# Patient Record
Sex: Male | Born: 1955 | Race: White | Hispanic: No | Marital: Single | Smoking: Never smoker
Health system: Southern US, Community
[De-identification: ages and names within clinical notes are randomized; demographics above are authoritative.]

## PROBLEM LIST (undated history)

## (undated) DIAGNOSIS — K589 Irritable bowel syndrome without diarrhea: Secondary | ICD-10-CM

## (undated) DIAGNOSIS — D126 Benign neoplasm of colon, unspecified: Secondary | ICD-10-CM

## (undated) DIAGNOSIS — E041 Nontoxic single thyroid nodule: Secondary | ICD-10-CM

## (undated) DIAGNOSIS — D61818 Other pancytopenia: Secondary | ICD-10-CM

## (undated) DIAGNOSIS — E039 Hypothyroidism, unspecified: Secondary | ICD-10-CM

## (undated) DIAGNOSIS — R011 Cardiac murmur, unspecified: Secondary | ICD-10-CM

## (undated) DIAGNOSIS — E291 Testicular hypofunction: Secondary | ICD-10-CM

## (undated) DIAGNOSIS — K648 Other hemorrhoids: Secondary | ICD-10-CM

## (undated) DIAGNOSIS — K602 Anal fissure, unspecified: Secondary | ICD-10-CM

## (undated) HISTORY — DX: Anal fissure, unspecified: K60.2

## (undated) HISTORY — DX: Hypothyroidism, unspecified: E03.9

## (undated) HISTORY — DX: Testicular hypofunction: E29.1

## (undated) HISTORY — PX: TONSILLECTOMY: SUR1361

## (undated) HISTORY — DX: Nontoxic single thyroid nodule: E04.1

## (undated) HISTORY — DX: Other hemorrhoids: K64.8

## (undated) HISTORY — DX: Benign neoplasm of colon, unspecified: D12.6

## (undated) HISTORY — DX: Irritable bowel syndrome, unspecified: K58.9

## (undated) HISTORY — DX: Other pancytopenia: D61.818

## (undated) HISTORY — DX: Cardiac murmur, unspecified: R01.1

---

## 2003-12-26 ENCOUNTER — Emergency Department (HOSPITAL_COMMUNITY): Admission: EM | Admit: 2003-12-26 | Discharge: 2003-12-26 | Payer: Self-pay | Admitting: Family Medicine

## 2004-01-06 ENCOUNTER — Emergency Department (HOSPITAL_COMMUNITY): Admission: EM | Admit: 2004-01-06 | Discharge: 2004-01-06 | Payer: Self-pay | Admitting: Family Medicine

## 2004-01-22 ENCOUNTER — Ambulatory Visit: Payer: Self-pay | Admitting: Gastroenterology

## 2004-09-22 ENCOUNTER — Ambulatory Visit: Payer: Self-pay

## 2004-09-30 ENCOUNTER — Ambulatory Visit: Payer: Self-pay

## 2004-10-07 ENCOUNTER — Ambulatory Visit: Payer: Self-pay

## 2004-10-13 ENCOUNTER — Ambulatory Visit: Payer: Self-pay

## 2004-11-11 ENCOUNTER — Ambulatory Visit: Payer: Self-pay

## 2004-11-25 ENCOUNTER — Ambulatory Visit: Payer: Self-pay

## 2004-12-16 ENCOUNTER — Ambulatory Visit: Payer: Self-pay

## 2004-12-30 ENCOUNTER — Ambulatory Visit: Payer: Self-pay

## 2005-01-01 ENCOUNTER — Ambulatory Visit: Payer: Self-pay | Admitting: Gastroenterology

## 2005-01-06 ENCOUNTER — Ambulatory Visit: Payer: Self-pay

## 2005-01-11 DIAGNOSIS — D126 Benign neoplasm of colon, unspecified: Secondary | ICD-10-CM

## 2005-01-11 HISTORY — PX: COLONOSCOPY: SHX174

## 2005-01-11 HISTORY — DX: Benign neoplasm of colon, unspecified: D12.6

## 2005-01-13 ENCOUNTER — Ambulatory Visit: Payer: Self-pay

## 2005-01-20 ENCOUNTER — Ambulatory Visit: Payer: Self-pay

## 2005-02-18 ENCOUNTER — Ambulatory Visit: Payer: Self-pay | Admitting: Internal Medicine

## 2005-02-24 ENCOUNTER — Ambulatory Visit: Payer: Self-pay

## 2005-03-03 ENCOUNTER — Ambulatory Visit: Payer: Self-pay

## 2005-03-10 ENCOUNTER — Ambulatory Visit: Payer: Self-pay

## 2005-03-16 ENCOUNTER — Ambulatory Visit: Payer: Self-pay

## 2005-03-31 ENCOUNTER — Ambulatory Visit: Payer: Self-pay

## 2005-04-07 ENCOUNTER — Ambulatory Visit: Payer: Self-pay

## 2005-04-21 ENCOUNTER — Ambulatory Visit: Payer: Self-pay

## 2005-04-28 ENCOUNTER — Ambulatory Visit: Payer: Self-pay

## 2005-05-05 ENCOUNTER — Ambulatory Visit: Payer: Self-pay

## 2005-05-12 ENCOUNTER — Ambulatory Visit: Payer: Self-pay

## 2005-05-26 ENCOUNTER — Ambulatory Visit: Payer: Self-pay

## 2005-06-09 ENCOUNTER — Ambulatory Visit: Payer: Self-pay

## 2005-06-16 ENCOUNTER — Ambulatory Visit: Payer: Self-pay

## 2005-06-30 ENCOUNTER — Ambulatory Visit: Payer: Self-pay

## 2005-07-13 ENCOUNTER — Ambulatory Visit: Payer: Self-pay

## 2005-07-20 ENCOUNTER — Ambulatory Visit: Payer: Self-pay

## 2005-07-28 ENCOUNTER — Ambulatory Visit: Payer: Self-pay

## 2005-09-15 ENCOUNTER — Ambulatory Visit: Payer: Self-pay

## 2005-10-13 ENCOUNTER — Ambulatory Visit: Payer: Self-pay | Admitting: Internal Medicine

## 2005-10-20 ENCOUNTER — Ambulatory Visit: Payer: Self-pay

## 2005-11-09 ENCOUNTER — Ambulatory Visit: Payer: Self-pay

## 2005-11-16 ENCOUNTER — Ambulatory Visit: Payer: Self-pay

## 2005-11-23 ENCOUNTER — Ambulatory Visit: Payer: Self-pay

## 2005-11-30 ENCOUNTER — Ambulatory Visit: Payer: Self-pay

## 2005-12-14 ENCOUNTER — Ambulatory Visit: Payer: Self-pay

## 2005-12-21 ENCOUNTER — Ambulatory Visit: Payer: Self-pay

## 2005-12-28 ENCOUNTER — Ambulatory Visit: Payer: Self-pay

## 2006-01-12 ENCOUNTER — Ambulatory Visit: Payer: Self-pay

## 2006-01-18 ENCOUNTER — Ambulatory Visit: Payer: Self-pay

## 2006-02-15 ENCOUNTER — Ambulatory Visit: Payer: Self-pay

## 2006-02-22 ENCOUNTER — Ambulatory Visit: Payer: Self-pay

## 2006-03-01 ENCOUNTER — Ambulatory Visit: Payer: Self-pay

## 2006-03-08 ENCOUNTER — Ambulatory Visit: Payer: Self-pay

## 2006-03-16 ENCOUNTER — Ambulatory Visit: Payer: Self-pay

## 2006-09-13 ENCOUNTER — Ambulatory Visit: Payer: Self-pay

## 2006-11-04 DIAGNOSIS — K589 Irritable bowel syndrome without diarrhea: Secondary | ICD-10-CM

## 2006-11-04 DIAGNOSIS — D128 Benign neoplasm of rectum: Secondary | ICD-10-CM

## 2006-11-15 ENCOUNTER — Ambulatory Visit: Payer: Self-pay

## 2006-12-27 ENCOUNTER — Ambulatory Visit: Payer: Self-pay

## 2007-07-11 ENCOUNTER — Ambulatory Visit: Payer: Self-pay

## 2007-08-02 ENCOUNTER — Ambulatory Visit: Payer: Self-pay

## 2007-10-03 ENCOUNTER — Ambulatory Visit: Payer: Self-pay | Admitting: Internal Medicine

## 2007-11-29 ENCOUNTER — Telehealth: Payer: Self-pay | Admitting: Internal Medicine

## 2007-12-20 ENCOUNTER — Ambulatory Visit: Payer: Self-pay

## 2007-12-26 ENCOUNTER — Ambulatory Visit: Payer: Self-pay

## 2008-01-03 ENCOUNTER — Ambulatory Visit: Payer: Self-pay

## 2008-01-17 ENCOUNTER — Ambulatory Visit: Payer: Self-pay

## 2008-02-14 ENCOUNTER — Ambulatory Visit: Payer: Self-pay

## 2008-02-27 ENCOUNTER — Ambulatory Visit: Payer: Self-pay

## 2008-03-20 ENCOUNTER — Ambulatory Visit: Payer: Self-pay

## 2008-04-17 ENCOUNTER — Ambulatory Visit: Payer: Self-pay

## 2008-04-23 ENCOUNTER — Ambulatory Visit: Payer: Self-pay

## 2008-05-29 ENCOUNTER — Ambulatory Visit: Payer: Self-pay

## 2008-06-26 ENCOUNTER — Ambulatory Visit: Payer: Self-pay

## 2008-07-03 ENCOUNTER — Ambulatory Visit: Payer: Self-pay

## 2008-07-10 ENCOUNTER — Ambulatory Visit: Payer: Self-pay

## 2008-07-24 ENCOUNTER — Ambulatory Visit: Payer: Self-pay

## 2008-08-06 ENCOUNTER — Ambulatory Visit: Payer: Self-pay

## 2008-09-10 ENCOUNTER — Ambulatory Visit: Payer: Self-pay

## 2009-02-13 ENCOUNTER — Telehealth: Payer: Self-pay | Admitting: Internal Medicine

## 2010-02-10 NOTE — Progress Notes (Signed)
Summary: request  Phone Note Call from Patient   Caller: Patient [phone] Summary of Call: Pt req that Dr Cato Mulligan submit a requisition to [facility].... Pt wants to go a Careers adviser / orthodontist due to ongoing jaw pain and will need images prior to scheduling his appt??... Therefore, he adv would like to have either Dr Cato Mulligan or Alvino Chapel, RN call him at their convenience.... Pt can be reached at [phone]. Initial call taken by: Debbra Riding,  February 13, 2009 3:10 PM  Follow-up for Phone Call        Pt called to adv that he was returning Ellen's call Alvino Chapel is currently w/ a pt)... Pt req that either Alvino Chapel, RN or Dr Cato Mulligan return his call ASAP. Follow-up by: Debbra Riding,  February 14, 2009 9:22 AM  Additional Follow-up for Phone Call Additional follow up Details #1::        Requests order for TMJ MRI (40347) w/o contrast and mandible complete xray ([id number]) at [facility] Liborio Nixon at [phone] ph, [phone] fax) for jaw pain.  Awoke 4 weeks ago with jaw pain,. Saw dds who will not order (lisa adornetto) but sent to dental surgeon who requires appt at $300.  Two weeks ago pain subsided except clicking TMJ and two days ago sharp pain from jaw into neck so jaw is lopsided to left.  Pt out of work so trying to cut corners of appts.  Explained he may need appt before can be ordered.  LAST SEEN 10/03/07. Additional Follow-up by: Gladis Riffle, RN,  February 14, 2009 2:27 PM    Additional Follow-up for Phone Call Additional follow up Details #2::    this type of MRI should be ordered by oral surgeon---I have never ordered before. would be reasonable for him to see oral surgeon  Follow-up by: Birdie Sons MD,  February 14, 2009 3:20 PM  Additional Follow-up for Phone Call Additional follow up Details #3:: Details for Additional Follow-up Action Taken: Patient notified.  Additional Follow-up by: Gladis Riffle, RN,  February 17, 2009 12:22 PM

## 2010-04-08 ENCOUNTER — Telehealth: Payer: Self-pay | Admitting: Internal Medicine

## 2010-04-08 NOTE — Telephone Encounter (Signed)
Pt had labs done at [facility] and his WBC was 2.4 and his Platelets were 101.  He said they have been going down since 1998 and he feels fine.  He is unemployed and a little fatigue but feels fine and is very active

## 2010-04-08 NOTE — Telephone Encounter (Signed)
Pt called and said that he rcvd some lab results that are very alarming and is needing call back asap. Needs to know what Dr Cato Mulligan wants him to do.

## 2010-04-09 NOTE — Telephone Encounter (Signed)
LMTCB

## 2010-04-09 NOTE — Telephone Encounter (Signed)
I haven't seen him in a few years. I'm not sure what his laboratory work indicates. If he wanted a professional opinion I would recommend a full physical perhaps followed by a hematology consult.

## 2010-04-10 NOTE — Telephone Encounter (Signed)
Pt aware and will call back if he wants to schedule appt

## 2010-04-10 NOTE — Telephone Encounter (Signed)
LMTCB

## 2010-07-20 ENCOUNTER — Ambulatory Visit (INDEPENDENT_AMBULATORY_CARE_PROVIDER_SITE_OTHER): Payer: Self-pay | Admitting: Internal Medicine

## 2010-07-20 ENCOUNTER — Encounter: Payer: Self-pay | Admitting: Internal Medicine

## 2010-07-20 DIAGNOSIS — R091 Pleurisy: Secondary | ICD-10-CM

## 2010-07-20 NOTE — Progress Notes (Signed)
  Subjective:    Patient ID: Stephen [name], male    DOB: Oct 17, 1955, 55 y.o.   MRN: [id number]  HPI  55 year old patient who woke at 7:30 this morning with left upper quadrant discomfort. He describes a very sharp localized pain that is aggravated by deep inspiration. Pain is also aggravated by lying on the left lateral decubitus position. No shortness of breath cough or URI symptoms. He states that he has had similar pain intermittently since 2002. He also states that he had a ENT evaluation 3 months ago. He is under much situational stress.    Review of Systems  Cardiovascular: Positive for chest pain.       Objective:   Physical Exam  Constitutional: He is oriented to person, place, and time. He appears well-developed.  HENT:  Head: Normocephalic.  Right Ear: External ear normal.  Left Ear: External ear normal.  Eyes: Conjunctivae and EOM are normal.  Neck: Normal range of motion.  Cardiovascular: Normal rate and normal heart sounds.   Pulmonary/Chest: Effort normal and breath sounds normal. No respiratory distress. He has no wheezes. He has no rales.       No rub  Abdominal: Soft. Bowel sounds are normal. He exhibits no distension. There is no tenderness. There is no rebound.       There was no tenderness along the left costal margin  Musculoskeletal: Normal range of motion. He exhibits no edema and no tenderness.  Neurological: He is alert and oriented to person, place, and time.  Psychiatric: He has a normal mood and affect. His behavior is normal.          Assessment & Plan:   Pleurisy. Etiology obscure. He has had several similar episodes over the past decade with  prompt resolution. We'll give samples of  Vimovo to take twice daily. He'll call if he does not improve or if he develops any new symptoms

## 2010-07-20 NOTE — Patient Instructions (Signed)
Vimovo 1 twice daily Call or return to clinic prn if these symptoms worsen or fail to improve as anticipated.  

## 2010-08-26 ENCOUNTER — Ambulatory Visit (INDEPENDENT_AMBULATORY_CARE_PROVIDER_SITE_OTHER): Payer: Self-pay

## 2010-08-26 DIAGNOSIS — F4320 Adjustment disorder, unspecified: Secondary | ICD-10-CM

## 2010-09-08 ENCOUNTER — Ambulatory Visit: Payer: Self-pay

## 2010-09-09 ENCOUNTER — Ambulatory Visit (INDEPENDENT_AMBULATORY_CARE_PROVIDER_SITE_OTHER): Payer: Self-pay

## 2010-09-09 ENCOUNTER — Ambulatory Visit: Payer: Self-pay

## 2010-09-09 DIAGNOSIS — F4320 Adjustment disorder, unspecified: Secondary | ICD-10-CM

## 2010-09-23 ENCOUNTER — Ambulatory Visit (INDEPENDENT_AMBULATORY_CARE_PROVIDER_SITE_OTHER): Payer: Self-pay

## 2010-09-23 DIAGNOSIS — F4320 Adjustment disorder, unspecified: Secondary | ICD-10-CM

## 2010-10-06 ENCOUNTER — Ambulatory Visit (INDEPENDENT_AMBULATORY_CARE_PROVIDER_SITE_OTHER): Payer: Self-pay

## 2010-10-06 ENCOUNTER — Telehealth: Payer: Self-pay | Admitting: Internal Medicine

## 2010-10-06 DIAGNOSIS — F4320 Adjustment disorder, unspecified: Secondary | ICD-10-CM

## 2010-10-06 NOTE — Telephone Encounter (Signed)
Please return call about a condition. Would like a call from [facility] today.

## 2010-10-06 NOTE — Telephone Encounter (Signed)
Pt must have called Triage back between calls, but he wanted to know who to see for a " synovial cyst"  We had a long discussion about his symptoms, and I advised him to call ORTHO and discuss his condition, and they will prob send him to a hand surgeon.  Pt was pleased with this advice as he did not want to see more than one MD unless it was absolutely necessary.  Discussed it briefly with Dr. Artist Pais, and he agreed.

## 2010-10-28 ENCOUNTER — Ambulatory Visit (INDEPENDENT_AMBULATORY_CARE_PROVIDER_SITE_OTHER): Payer: Self-pay

## 2010-10-28 DIAGNOSIS — F4320 Adjustment disorder, unspecified: Secondary | ICD-10-CM

## 2010-11-11 ENCOUNTER — Ambulatory Visit (INDEPENDENT_AMBULATORY_CARE_PROVIDER_SITE_OTHER): Payer: Self-pay

## 2010-11-11 DIAGNOSIS — F4320 Adjustment disorder, unspecified: Secondary | ICD-10-CM

## 2011-02-02 ENCOUNTER — Ambulatory Visit (INDEPENDENT_AMBULATORY_CARE_PROVIDER_SITE_OTHER): Payer: Self-pay

## 2011-02-02 DIAGNOSIS — F4320 Adjustment disorder, unspecified: Secondary | ICD-10-CM

## 2011-02-15 NOTE — Progress Notes (Addendum)
  Subjective:    Patient ID: Stephen [name], male    DOB: 04-25-1955, 56 y.o.   MRN: [id number]  HPI    Review of Systems  Cardiovascular: Chest pain: complains of noncardiac chest wall pleurisy.       Objective:   Physical Exam        Assessment & Plan:

## 2011-03-09 ENCOUNTER — Encounter: Payer: Self-pay | Admitting: Internal Medicine

## 2011-03-23 ENCOUNTER — Ambulatory Visit: Payer: Self-pay

## 2011-03-25 ENCOUNTER — Ambulatory Visit (INDEPENDENT_AMBULATORY_CARE_PROVIDER_SITE_OTHER): Payer: Self-pay

## 2011-03-25 DIAGNOSIS — F4320 Adjustment disorder, unspecified: Secondary | ICD-10-CM

## 2011-05-05 ENCOUNTER — Ambulatory Visit (INDEPENDENT_AMBULATORY_CARE_PROVIDER_SITE_OTHER): Payer: Self-pay

## 2011-05-05 DIAGNOSIS — F4320 Adjustment disorder, unspecified: Secondary | ICD-10-CM

## 2011-07-20 ENCOUNTER — Ambulatory Visit (INDEPENDENT_AMBULATORY_CARE_PROVIDER_SITE_OTHER): Payer: Self-pay

## 2011-07-20 DIAGNOSIS — F4320 Adjustment disorder, unspecified: Secondary | ICD-10-CM

## 2011-08-11 ENCOUNTER — Ambulatory Visit: Payer: Self-pay

## 2011-08-17 ENCOUNTER — Ambulatory Visit (INDEPENDENT_AMBULATORY_CARE_PROVIDER_SITE_OTHER): Payer: Self-pay

## 2011-08-17 DIAGNOSIS — F4320 Adjustment disorder, unspecified: Secondary | ICD-10-CM

## 2011-10-26 ENCOUNTER — Ambulatory Visit (INDEPENDENT_AMBULATORY_CARE_PROVIDER_SITE_OTHER): Payer: Self-pay

## 2011-10-26 DIAGNOSIS — F4320 Adjustment disorder, unspecified: Secondary | ICD-10-CM

## 2011-11-17 ENCOUNTER — Ambulatory Visit (INDEPENDENT_AMBULATORY_CARE_PROVIDER_SITE_OTHER): Payer: Self-pay

## 2011-11-17 DIAGNOSIS — F4320 Adjustment disorder, unspecified: Secondary | ICD-10-CM

## 2011-11-30 ENCOUNTER — Ambulatory Visit (INDEPENDENT_AMBULATORY_CARE_PROVIDER_SITE_OTHER): Payer: Self-pay

## 2011-11-30 DIAGNOSIS — F4320 Adjustment disorder, unspecified: Secondary | ICD-10-CM

## 2011-12-14 ENCOUNTER — Ambulatory Visit (INDEPENDENT_AMBULATORY_CARE_PROVIDER_SITE_OTHER): Payer: Self-pay

## 2011-12-14 DIAGNOSIS — F4320 Adjustment disorder, unspecified: Secondary | ICD-10-CM

## 2011-12-28 ENCOUNTER — Ambulatory Visit (INDEPENDENT_AMBULATORY_CARE_PROVIDER_SITE_OTHER): Payer: Self-pay

## 2011-12-28 DIAGNOSIS — F4320 Adjustment disorder, unspecified: Secondary | ICD-10-CM

## 2012-01-18 ENCOUNTER — Ambulatory Visit (INDEPENDENT_AMBULATORY_CARE_PROVIDER_SITE_OTHER): Payer: Self-pay

## 2012-01-18 DIAGNOSIS — F4320 Adjustment disorder, unspecified: Secondary | ICD-10-CM

## 2012-02-15 ENCOUNTER — Ambulatory Visit (INDEPENDENT_AMBULATORY_CARE_PROVIDER_SITE_OTHER): Payer: Self-pay

## 2012-02-15 DIAGNOSIS — F4320 Adjustment disorder, unspecified: Secondary | ICD-10-CM

## 2012-03-01 ENCOUNTER — Ambulatory Visit (INDEPENDENT_AMBULATORY_CARE_PROVIDER_SITE_OTHER): Payer: Self-pay

## 2012-03-01 DIAGNOSIS — F4320 Adjustment disorder, unspecified: Secondary | ICD-10-CM

## 2012-04-11 ENCOUNTER — Ambulatory Visit (INDEPENDENT_AMBULATORY_CARE_PROVIDER_SITE_OTHER): Payer: Self-pay

## 2012-04-11 DIAGNOSIS — F4320 Adjustment disorder, unspecified: Secondary | ICD-10-CM

## 2012-04-25 ENCOUNTER — Ambulatory Visit: Payer: Self-pay

## 2012-05-02 ENCOUNTER — Ambulatory Visit (INDEPENDENT_AMBULATORY_CARE_PROVIDER_SITE_OTHER): Payer: Self-pay

## 2012-05-02 DIAGNOSIS — F4320 Adjustment disorder, unspecified: Secondary | ICD-10-CM

## 2012-05-23 ENCOUNTER — Ambulatory Visit (INDEPENDENT_AMBULATORY_CARE_PROVIDER_SITE_OTHER): Payer: Self-pay

## 2012-05-23 DIAGNOSIS — F4320 Adjustment disorder, unspecified: Secondary | ICD-10-CM

## 2012-06-07 ENCOUNTER — Ambulatory Visit (INDEPENDENT_AMBULATORY_CARE_PROVIDER_SITE_OTHER): Payer: Self-pay

## 2012-06-07 DIAGNOSIS — F4320 Adjustment disorder, unspecified: Secondary | ICD-10-CM

## 2012-06-22 ENCOUNTER — Telehealth: Payer: Self-pay | Admitting: Internal Medicine

## 2012-06-22 NOTE — Telephone Encounter (Signed)
Called to ask if Dr Cato Mulligan has the ability to write the prescription for Anastrozole, if he scheduled an appointment. 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  Seeking this particular Rx to inhibit Estradiol.  [facility] provider does not have the ability to write the Rx. Please call back on this number.  Patient informed Dr Cato Mulligan is out of the office until 06/26/12.

## 2012-06-29 NOTE — Telephone Encounter (Signed)
Doubt he needs it- Would like him to review with endocrinology. Refer endocrinology

## 2012-06-30 NOTE — Telephone Encounter (Signed)
Left message for pt to call back  °

## 2012-06-30 NOTE — Telephone Encounter (Signed)
Pt does not want referral to endocrinology.  He has an appt with a [facility] dr in august and he will wait till then

## 2012-07-18 ENCOUNTER — Ambulatory Visit (INDEPENDENT_AMBULATORY_CARE_PROVIDER_SITE_OTHER): Payer: Self-pay

## 2012-07-18 DIAGNOSIS — F4320 Adjustment disorder, unspecified: Secondary | ICD-10-CM

## 2012-10-10 ENCOUNTER — Ambulatory Visit (INDEPENDENT_AMBULATORY_CARE_PROVIDER_SITE_OTHER): Payer: Self-pay

## 2012-10-10 DIAGNOSIS — F4320 Adjustment disorder, unspecified: Secondary | ICD-10-CM

## 2012-12-12 ENCOUNTER — Ambulatory Visit (INDEPENDENT_AMBULATORY_CARE_PROVIDER_SITE_OTHER): Payer: Self-pay

## 2012-12-12 DIAGNOSIS — F4320 Adjustment disorder, unspecified: Secondary | ICD-10-CM

## 2013-02-20 ENCOUNTER — Ambulatory Visit (INDEPENDENT_AMBULATORY_CARE_PROVIDER_SITE_OTHER): Payer: Self-pay

## 2013-02-20 DIAGNOSIS — F4320 Adjustment disorder, unspecified: Secondary | ICD-10-CM

## 2013-04-11 ENCOUNTER — Ambulatory Visit (INDEPENDENT_AMBULATORY_CARE_PROVIDER_SITE_OTHER): Payer: Self-pay

## 2013-04-11 DIAGNOSIS — F4320 Adjustment disorder, unspecified: Secondary | ICD-10-CM

## 2013-04-15 ENCOUNTER — Ambulatory Visit: Payer: Self-pay

## 2013-04-15 ENCOUNTER — Ambulatory Visit: Payer: Self-pay | Admitting: Emergency Medicine

## 2013-04-15 VITALS — BP 118/60 | HR 96 | Temp 99.3°F | Resp 16 | Ht 64.5 in | Wt 118.0 lb

## 2013-04-15 DIAGNOSIS — R05 Cough: Secondary | ICD-10-CM

## 2013-04-15 DIAGNOSIS — R7989 Other specified abnormal findings of blood chemistry: Secondary | ICD-10-CM

## 2013-04-15 DIAGNOSIS — R52 Pain, unspecified: Secondary | ICD-10-CM

## 2013-04-15 DIAGNOSIS — R509 Fever, unspecified: Secondary | ICD-10-CM

## 2013-04-15 DIAGNOSIS — R059 Cough, unspecified: Secondary | ICD-10-CM

## 2013-04-15 DIAGNOSIS — R918 Other nonspecific abnormal finding of lung field: Secondary | ICD-10-CM

## 2013-04-15 DIAGNOSIS — J209 Acute bronchitis, unspecified: Secondary | ICD-10-CM

## 2013-04-15 DIAGNOSIS — R9389 Abnormal findings on diagnostic imaging of other specified body structures: Secondary | ICD-10-CM

## 2013-04-15 DIAGNOSIS — R718 Other abnormality of red blood cells: Secondary | ICD-10-CM

## 2013-04-15 LAB — POCT CBC
GRANULOCYTE PERCENT: 79.9 % (ref 37–80)
HCT, POC: 45 % (ref 43.5–53.7)
HEMOGLOBIN: 14.6 g/dL (ref 14.1–18.1)
Lymph, poc: 0.9 (ref 0.6–3.4)
MCH: 34.4 pg — AB (ref 27–31.2)
MCHC: 32.4 g/dL (ref 31.8–35.4)
MCV: 105.8 fL — AB (ref 80–97)
MID (CBC): 0.4 (ref 0–0.9)
MPV: 9.5 fL (ref 0–99.8)
PLATELET COUNT, POC: 120 10*3/uL — AB (ref 142–424)
POC Granulocyte: 5.2 (ref 2–6.9)
POC LYMPH PERCENT: 13.4 %L (ref 10–50)
POC MID %: 6.7 %M (ref 0–12)
RBC: 4.25 M/uL — AB (ref 4.69–6.13)
RDW, POC: 13.8 %
WBC: 6.5 10*3/uL (ref 4.6–10.2)

## 2013-04-15 LAB — VITAMIN B12: VITAMIN B 12: 1638 pg/mL — AB (ref 211–911)

## 2013-04-15 LAB — POCT INFLUENZA A/B
INFLUENZA A, POC: NEGATIVE
INFLUENZA B, POC: NEGATIVE

## 2013-04-15 MED ORDER — AZITHROMYCIN 250 MG PO TABS: ORAL_TABLET | ORAL | Status: DC

## 2013-04-15 NOTE — Patient Instructions (Signed)
I have made referrals for you to have a CT scan of your chest and to see the hematologist. If you are not better in 48-72 hours I would recommend you followup with Dr. Leanne Chang  Bronchitis Bronchitis is inflammation of the airways that extend from the windpipe into the lungs (bronchi). The inflammation often causes mucus to develop, which leads to a cough. If the inflammation becomes severe, it may cause shortness of breath. CAUSES  Bronchitis may be caused by:   Viral infections.   Bacteria.   Cigarette smoke.   Allergens, pollutants, and other irritants.  SIGNS AND SYMPTOMS  The most common symptom of bronchitis is a frequent cough that produces mucus. Other symptoms include:  Fever.   Body aches.   Chest congestion.   Chills.   Shortness of breath.   Sore throat.  DIAGNOSIS  Bronchitis is usually diagnosed through a medical history and physical exam. Tests, such as chest X-rays, are sometimes done to rule out other conditions.  TREATMENT  You may need to avoid contact with whatever caused the problem (smoking, for example). Medicines are sometimes needed. These may include:  Antibiotics. These may be prescribed if the condition is caused by bacteria.  Cough suppressants. These may be prescribed for relief of cough symptoms.   Inhaled medicines. These may be prescribed to help open your airways and make it easier for you to breathe.   Steroid medicines. These may be prescribed for those with recurrent (chronic) bronchitis. HOME CARE INSTRUCTIONS  Get plenty of rest.   Drink enough fluids to keep your urine clear or pale yellow (unless you have a medical condition that requires fluid restriction). Increasing fluids may help thin your secretions and will prevent dehydration.   Only take over-the-counter or prescription medicines as directed by your health care provider.  Only take antibiotics as directed. Make sure you finish them even if you start to feel  better.  Avoid secondhand smoke, irritating chemicals, and strong fumes. These will make bronchitis worse. If you are a smoker, quit smoking. Consider using nicotine gum or skin patches to help control withdrawal symptoms. Quitting smoking will help your lungs heal faster.   Put a cool-mist humidifier in your bedroom at night to moisten the air. This may help loosen mucus. Change the water in the humidifier daily. You can also run the hot water in your shower and sit in the bathroom with the door closed for 5 10 minutes.   Follow up with your health care provider as directed.   Wash your hands frequently to avoid catching bronchitis again or spreading an infection to others.  SEEK MEDICAL CARE IF: Your symptoms do not improve after 1 week of treatment.  SEEK IMMEDIATE MEDICAL CARE IF:  Your fever increases.  You have chills.   You have chest pain.   You have worsening shortness of breath.   You have bloody sputum.  You faint.  You have lightheadedness.  You have a severe headache.   You vomit repeatedly. MAKE SURE YOU:   Understand these instructions.  Will watch your condition.  Will get help right away if you are not doing well or get worse. Document Released: 12/28/2004 Document Revised: 10/18/2012 Document Reviewed: 08/22/2012 [facility] Patient Information 2014 [place].

## 2013-04-15 NOTE — Progress Notes (Signed)
Subjective:    Patient ID: Stephen [name], male    DOB: 10-22-1955, 58 y.o.   MRN: [id number]  Chief Complaint  Patient presents with  . Cough    x 3 day   This chart was scribed for [name], MD by Zettie Pho, ED Scribe.   Cough Associated symptoms include chest pain, a fever (resolved), rhinorrhea and shortness of breath. His past medical history is significant for environmental allergies.   Stephen [name] is a 58 y.o. male who presents to [facility] complaining of a productive cough with yellow colored sputum with associated fever (Tmax 100.6 measured at home, patient is afebrile at 99.3 here) onset 3 days ago that he states has been progressively worsening and is worse at night. He reports some associated chest wall pain and shortness of breath that he states is exacerbated with deep breathing and coughing. Patient reports that his symptoms started with seasonal allergy-like symptoms including congestion and rhinorrhea 4 days ago. Patient reports some associated tinnitus that he states is chronic in nature, but has been worse than usual secondary to his current illness. He reports that he followed up with a PCP (Dr. Marilynne Halsted) for similar complaints and received testing for possible mold exposure, but is not aware of the results, and routine blood labs that indicated a decreased WBC. He states that he has not been evaluated further for his WBC. He states that he has received HIV testing in the past, which was negative, and that he has not been sexually active since he was tested. Patient reports that he has a relatively sedentary lifestyle and does not exercise regularly. He has a surgical history of bilateral tonsillectomy. Patient has no other pertinent medical history.   Patient Active Problem List   Diagnosis Date Noted  . IBS 11/04/2006  . COLONIC POLYPS, HX OF 11/04/2006   History reviewed. No pertinent past medical history.  Current Outpatient  Prescriptions on File Prior to Visit  Medication Sig Dispense Refill  . Multiple Vitamin (MULTIVITAMIN) tablet Take 1 tablet by mouth daily.        . NON FORMULARY OTC - per patient - a lot of Enzymes,amino acids and supplements       . vitamin C (ASCORBIC ACID) 500 MG tablet Take 500 mg by mouth daily.        Marland Kitchen VITAMIN D, CHOLECALCIFEROL, PO Take by mouth daily.        . vitamin E 400 UNIT capsule Take 400 Units by mouth daily.         No current facility-administered medications on file prior to visit.   No Known Allergies  Review of Systems  Constitutional: Positive for fever (resolved).  HENT: Positive for congestion, rhinorrhea and tinnitus.   Respiratory: Positive for cough and shortness of breath.   Cardiovascular: Positive for chest pain.  Allergic/Immunologic: Positive for environmental allergies.      Objective:   Physical Exam  CONSTITUTIONAL: Well developed/well nourished HEAD: Normocephalic/atraumatic EYES: EOMI/PERRL ENMT: Mucous membranes moist; fluid behind the TMs bilaterally  NECK: supple no meningeal signs SPINE: entire spine nontender CV: S1/S2 noted, no murmurs/rubs/gallops noted LUNGS: Lungs are clear to auscultation bilaterally, no apparent distress; coarse rhonchi bilaterally, equal breath sounds, no true wheezes ABDOMEN: soft, nontender, no rebound or guarding GU: no cva tenderness NEURO: Pt is awake/alert, moves all extremitiesx4 EXTREMITIES: pulses normal, full ROM SKIN: warm, color normal PSYCH: no abnormalities of mood noted  BP 118/60  Pulse  96  Temp(Src) 99.3 F (37.4 C) (Oral)  Resp 16  Ht 5' 4.5" (1.638 m)  Wt 118 lb (53.524 kg)  BMI 19.95 kg/m2  SpO2 98%     Results for orders placed in visit on 04/15/13  POCT CBC      Result Value Ref Range   WBC 6.5  4.6 - 10.2 K/uL   Lymph, poc 0.9  0.6 - 3.4   POC LYMPH PERCENT 13.4  10 - 50 %L   MID (cbc) 0.4  0 - 0.9   POC MID % 6.7  0 - 12 %M   POC Granulocyte 5.2  2 - 6.9    Granulocyte percent 79.9  37 - 80 %G   RBC 4.25 (*) 4.69 - 6.13 M/uL   Hemoglobin 14.6  14.1 - 18.1 g/dL   HCT, POC 45.0  43.5 - 53.7 %   MCV 105.8 (*) 80 - 97 fL   MCH, POC 34.4 (*) 27 - 31.2 pg   MCHC 32.4  31.8 - 35.4 g/dL   RDW, POC 13.8     Platelet Count, POC 120 (*) 142 - 424 K/uL   MPV 9.5  0 - 99.8 fL  [facility] reading (PRIMARY) by  Dr. Everlene Farrier or increased basal markings bilaterally. There are no consolidated infiltrates seen. Heart size is normal    Assessment & Plan:  10:11 AM- Will order a chest x-ray and CBC due to concern for pneumonia. Advised patient to follow up with a hematologist to further evaluate his blood counts the radiologist was concerned about a possible nodule in the left apex and patient will be referred for CT chest .. Discussed treatment plan with patient at bedside and patient verbalized agreement.

## 2013-04-15 NOTE — Progress Notes (Addendum)
Subjective:    Patient ID: Stephen [name], male    DOB: [birth year], 58 y.o.   MRN: [id number]  Chief Complaint  Patient presents with   Cough    x 3 day   This chart was scribed for [name], MD by Zettie Pho, ED Scribe.   HPI Stephen [name] is a 58 y.o. male who presents to [facility] complaining of a productive cough with yellow colored sputum with associated fever (Tmax 100.6 measured at home, patient is afebrile at 99.3 here) onset 3 days ago that he states has been progressively worsening and is worse at night. He reports some associated chest wall pain and shortness of breath that he states is exacerbated with deep breathing and coughing. Patient reports that his symptoms started with seasonal allergy-like symptoms including congestion and rhinorrhea 4 days ago. Patient reports some associated tinnitus that he states is chronic in nature, but has been worse than usual secondary to his current illness. He reports that he followed up with a PCP (Dr. Marilynne Halsted) for similar complaints and received testing for possible mold exposure, but is not aware of the results, and routine blood labs that indicated a decreased WBC. He states that he has not been evaluated further for his WBC. He states that he has received HIV testing in the past, which was negative, and that he has not been sexually active since he was tested. Patient reports that he has a relatively sedentary lifestyle and does not exercise regularly. He has a surgical history of bilateral tonsillectomy. Patient has no other pertinent medical history.   Patient Active Problem List   Diagnosis Date Noted   IBS 11/04/2006   COLONIC POLYPS, HX OF 11/04/2006   History reviewed. No pertinent past medical history.  Current Outpatient Prescriptions on File Prior to Visit  Medication Sig Dispense Refill   Multiple Vitamin (MULTIVITAMIN) tablet Take 1 tablet by mouth daily.         NON FORMULARY OTC  - per patient - a lot of Enzymes,amino acids and supplements        vitamin C (ASCORBIC ACID) 500 MG tablet Take 500 mg by mouth daily.         VITAMIN D, CHOLECALCIFEROL, PO Take by mouth daily.         vitamin E 400 UNIT capsule Take 400 Units by mouth daily.         No current facility-administered medications on file prior to visit.   No Known Allergies  Review of Systems  Constitutional: Positive for fever (resolved).  HENT: Positive for congestion, rhinorrhea and tinnitus.   Respiratory: Positive for cough and shortness of breath.   Cardiovascular: Positive for chest pain.  Allergic/Immunologic: Positive for environmental allergies.      Objective:   Physical Exam  CONSTITUTIONAL: Well developed/well nourished HEAD: Normocephalic/atraumatic EYES: EOMI/PERRL ENMT: Mucous membranes moist; fluid behind the TMs bilaterally  NECK: supple no meningeal signs SPINE: entire spine nontender CV: S1/S2 noted, no murmurs/rubs/gallops noted LUNGS: Lungs are clear to auscultation bilaterally, no apparent distress; coarse rhonchi bilaterally, equal breath sounds, no true wheezes ABDOMEN: soft, nontender, no rebound or guarding GU: no cva tenderness NEURO: Pt is awake/alert, moves all extremitiesx4 EXTREMITIES: pulses normal, full ROM SKIN: warm, color normal PSYCH: no abnormalities of mood noted  BP 118/60   Pulse 96   Temp(Src) 99.3 F (37.4 C) (Oral)   Resp 16   Ht 5' 4.5" (1.638 m)   [facility]  118 lb (53.524 kg)   BMI 19.95 kg/m2   SpO2 98%     Results for orders placed in visit on 04/15/13  POCT CBC      Result Value Ref Range   WBC 6.5  4.6 - 10.2 K/uL   Lymph, poc 0.9  0.6 - 3.4   POC LYMPH PERCENT 13.4  10 - 50 %L   MID (cbc) 0.4  0 - 0.9   POC MID % 6.7  0 - 12 %M   POC Granulocyte 5.2  2 - 6.9   Granulocyte percent 79.9  37 - 80 %G   RBC 4.25 (*) 4.69 - 6.13 M/uL   Hemoglobin 14.6  14.1 - 18.1 g/dL   HCT, POC 45.0  43.5 - 53.7 %   MCV 105.8 (*) 80 - 97 fL   MCH, POC  34.4 (*) 27 - 31.2 pg   MCHC 32.4  31.8 - 35.4 g/dL   RDW, POC 13.8     Platelet Count, POC 120 (*) 142 - 424 K/uL   MPV 9.5  0 - 99.8 fL  [facility] reading (PRIMARY) by  Dr. Everlene Farrier or increased basal markings bilaterally. There are no consolidated infiltrates seen. Heart size is normal    Assessment & Plan:  10:11 AM- Will order a chest x-ray and CBC due to concern for pneumonia. Advised patient to follow up with a hematologist to further evaluate his blood counts. Discussed treatment plan with patient at bedside and patient verbalized agreement.

## 2013-04-16 ENCOUNTER — Telehealth: Payer: Self-pay | Admitting: Hematology and Oncology

## 2013-04-16 LAB — PATHOLOGIST SMEAR REVIEW

## 2013-04-16 NOTE — Telephone Encounter (Signed)
BUSY SIGNAL NOT ABLE TO REACH PATIENT.

## 2013-04-19 ENCOUNTER — Telehealth: Payer: Self-pay

## 2013-04-19 NOTE — Telephone Encounter (Signed)
Pt had questions about chest congestion, he feels like his chest is vibrating and nothing comes up.  Advised pt per Judson Roch to increase the Mucinex to twice a day, drink plenty of water, and hot showers.

## 2013-04-19 NOTE — Telephone Encounter (Signed)
Patient called and states he was seen here on Sunday. He has questions regarding his prescriptions. Please return call ASAP. Thank you.

## 2013-04-21 ENCOUNTER — Telehealth: Payer: Self-pay

## 2013-04-21 NOTE — Telephone Encounter (Signed)
Patient has a question about a referral and some orders that were supposed to be placed for hematology. Explained to patient that someone can give him a call back with the information that he is requesting to which the patient replied "it is not rocket science or a medically complicated situation". Told patient to expect a call back later today or tomorrow.  [phone]

## 2013-04-21 NOTE — Telephone Encounter (Signed)
Dr. Everlene Farrier -- pt would like you to call him to speak to him directly about testing and lab work.   Pt would like an update on this referral.- he is a self pay pt. No insurance. No notes in referral in the system.  Pt referred to [facility]) - pt would like to know if there is any testing that could he have done prior to going to the specialist- pt is self-pay and is worried about cost of the testing and the specialist visits.  Talked to him about his B12 results- he is not surprised that it was so high. He takes a supplement each day- he states it was around 2100 at one point. He still takes a supplement- just not as much.   Pt would like to find out how Musinex is metabolized through his liver. He is concerned about his liver and the use of this medication.

## 2013-04-22 NOTE — Telephone Encounter (Signed)
Call patient and let him know I will call him tomorrow regarding his questions. I do feel he should proceed with a CT of his chest. I do feel if he wants to get  An answer  regarding the abnormalities found on CBC this is going to have to be done by the hematologist . I do not want to order other tests and they not be the ones recommended by the specialist

## 2013-04-23 NOTE — Telephone Encounter (Signed)
Left message on machine with Dr Perfecto Kingdom message and that he plans to call him today

## 2013-04-23 NOTE — Telephone Encounter (Signed)
Dr. Everlene Farrier spoke with patient.  We will go ahead with CT referral, patient will call Dr. Ripley Fraise and copy of labs and xray report are up front for patient to pick up.

## 2013-04-24 ENCOUNTER — Ambulatory Visit (INDEPENDENT_AMBULATORY_CARE_PROVIDER_SITE_OTHER): Payer: Self-pay

## 2013-04-24 DIAGNOSIS — F4320 Adjustment disorder, unspecified: Secondary | ICD-10-CM

## 2013-04-25 ENCOUNTER — Telehealth: Payer: Self-pay | Admitting: Radiology

## 2013-04-25 NOTE — Telephone Encounter (Signed)
Phone call to [facility]. Angie and Junie Panning have called requesting images of the slides from the Pathology smear. The lab is to call back regarding this. If the lab calls back regarding this, please direct the call to Mervyn Skeeters, Timoteo Expose, or Dr Everlene Farrier

## 2013-04-27 NOTE — Telephone Encounter (Signed)
The lab still has not called me back. Patient has called now, and he indicates his cough is still lingering. He is instructed to keep up with the Mucinex and he should gradually improve with this. He indicates he has some other problems he would like to discuss with you, he indicates preventative care needed. I have asked him to make appointment, but he would like to see you this weekend, he will come in on Sunday early morning to see you.

## 2013-05-08 ENCOUNTER — Ambulatory Visit (INDEPENDENT_AMBULATORY_CARE_PROVIDER_SITE_OTHER): Payer: Self-pay

## 2013-05-08 DIAGNOSIS — F4320 Adjustment disorder, unspecified: Secondary | ICD-10-CM

## 2013-05-11 ENCOUNTER — Telehealth: Payer: Self-pay

## 2013-05-11 NOTE — Telephone Encounter (Signed)
Patient called, he has requested several times to get the actual pathology smear slides from [facility].  Patient has been denied by [facility].  He has contacted HIPPA in [place] concerning his rights.  Per the patient he is legally allowed to get a copy of his slides.  Patient was given Denny Peon name and number since she is our Radiation protection practitioner with [employer].

## 2013-05-14 ENCOUNTER — Ambulatory Visit: Payer: Self-pay | Admitting: Emergency Medicine

## 2013-05-14 ENCOUNTER — Telehealth: Payer: Self-pay | Admitting: Internal Medicine

## 2013-05-14 ENCOUNTER — Encounter: Payer: Self-pay | Admitting: Emergency Medicine

## 2013-05-14 VITALS — BP 110/58 | HR 60 | Temp 98.1°F | Resp 16 | Ht 64.0 in | Wt 117.2 lb

## 2013-05-14 DIAGNOSIS — R634 Abnormal weight loss: Secondary | ICD-10-CM

## 2013-05-14 DIAGNOSIS — D696 Thrombocytopenia, unspecified: Secondary | ICD-10-CM

## 2013-05-14 DIAGNOSIS — R5383 Other fatigue: Secondary | ICD-10-CM

## 2013-05-14 DIAGNOSIS — R5381 Other malaise: Secondary | ICD-10-CM

## 2013-05-14 NOTE — Patient Instructions (Signed)
Please return to see me after your hematology evaluation for further workup

## 2013-05-14 NOTE — Telephone Encounter (Signed)
PATIENT CALLED TO SCHEDULE NP APPT FOR 05/15 @ 1:30 W/DR. CHIISM

## 2013-05-14 NOTE — Progress Notes (Signed)
   Subjective:    Patient ID: Stephen [name], male    DOB: Jan 09, 1956, 58 y.o.   MRN: [id number]  HPI patient in to discuss his situation regarding weight loss and persistent fatigue. He works with a facility in [place] that monitors his blood work. He takes a large number of supplements to help with his symptoms of fatigue. He was noted to have a high MCV borderline low white count and low platelets on his last visit and suggestion was made for him to follow up with the hematologist. He wants to take a holistic approach to his treatment. He works with a group called [facility] in [place]. Patient states he has a sensation that he feels overmedicated and drugs. 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 Earlean Shawl. A colonoscopy  was recommended to be repeated however he is hesitant to do this and would prefer DNA screening of his stool.    Review of Systems     Objective:   Physical Exam the patient's request he was not examined today        Assessment & Plan:   He agrees to followup with the hematologist and will call for that appointment. Following this I will be happy to see the patient and discuss the next step in his evaluation. I also advised patient to go ahead with the CT of his chest that has been ordered at [facility]

## 2013-05-15 ENCOUNTER — Telehealth: Payer: Self-pay | Admitting: Internal Medicine

## 2013-05-15 ENCOUNTER — Telehealth: Payer: Self-pay | Admitting: Medical Oncology

## 2013-05-15 ENCOUNTER — Telehealth: Payer: Self-pay | Admitting: Hematology and Oncology

## 2013-05-15 NOTE — Telephone Encounter (Signed)
C/D 05/15/13 for appt. 05/25/13

## 2013-05-15 NOTE — Telephone Encounter (Signed)
S/W PATIENT AND GAVE NP APPT FOR 05/19 @ 1:45 W/DR. [place].

## 2013-05-15 NOTE — Telephone Encounter (Signed)
F/U to patient's voice message. Patient inquiring as to what labs he may expect to have drawn as a typical hematology patient as well as asking whether Dr. Alvy Bimler has access to a microscope because he is bringing "smears of my blood work from [facility]" and expects Dr. Alvy Bimler to be able to look at these slides during his appt. Informed patient that Dr. Alvy Bimler will order any labs she deems medically necessary following his appt, and also informed patient the typical labs might be a CBC and chemistry panel in addition to anything else she thinks is pertinent to his case. Further informed patient that Dr. Alvy Bimler does have access to a microscope that is located at our onsite lab. Gave patient directions to clinic and asked patient to bring in a current list of medications at which point pt states he does not take any. Patient expressed thanks and knows to call office should he have any more questions prior to his appt on the 19th of this month.

## 2013-05-21 ENCOUNTER — Ambulatory Visit: Payer: Self-pay | Admitting: Emergency Medicine

## 2013-05-21 VITALS — BP 100/58 | HR 69 | Temp 97.9°F | Ht 64.0 in | Wt 120.0 lb

## 2013-05-21 DIAGNOSIS — R002 Palpitations: Secondary | ICD-10-CM

## 2013-05-21 NOTE — Patient Instructions (Signed)
Palpitations   A palpitation is the feeling that your heartbeat is irregular or is faster than normal. It may feel like your heart is fluttering or skipping a beat. Palpitations are usually not a serious problem. However, in some cases, you may need further medical evaluation.  CAUSES   Palpitations can be caused by:   Smoking.   Caffeine or other stimulants, such as diet pills or energy drinks.   Alcohol.   Stress and anxiety.   Strenuous physical activity.   Fatigue.   Certain medicines.   Heart disease, especially if you have a history of arrhythmias. This includes atrial fibrillation, atrial flutter, or supraventricular tachycardia.   An improperly working pacemaker or defibrillator.  DIAGNOSIS   To find the cause of your palpitations, your caregiver will take your history and perform a physical exam. Tests may also be done, including:   Electrocardiography (ECG). This test records the heart's electrical activity.   Cardiac monitoring. This allows your caregiver to monitor your heart rate and rhythm in real time.   Holter monitor. This is a portable device that records your heartbeat and can help diagnose heart arrhythmias. It allows your caregiver to track your heart activity for several days, if needed.   Stress tests by exercise or by giving medicine that makes the heart beat faster.  TREATMENT   Treatment of palpitations depends on the cause of your symptoms and can vary greatly. Most cases of palpitations do not require any treatment other than time, relaxation, and monitoring your symptoms. Other causes, such as atrial fibrillation, atrial flutter, or supraventricular tachycardia, usually require further treatment.  HOME CARE INSTRUCTIONS    Avoid:   Caffeinated coffee, tea, soft drinks, diet pills, and energy drinks.   Chocolate.   Alcohol.   Stop smoking if you smoke.   Reduce your stress and anxiety. Things that can help you relax include:   A method that measures bodily functions so  you can learn to control them (biofeedback).   Yoga.   Meditation.   Physical activity such as swimming, jogging, or walking.   Get plenty of rest and sleep.  SEEK MEDICAL CARE IF:    You continue to have a fast or irregular heartbeat beyond 24 hours.   Your palpitations occur more often.  SEEK IMMEDIATE MEDICAL CARE IF:   You develop chest pain or shortness of breath.   You have a severe headache.   You feel dizzy, or you faint.  MAKE SURE YOU:   Understand these instructions.   Will watch your condition.   Will get help right away if you are not doing well or get worse.  Document Released: 12/26/1999 Document Revised: 04/24/2012 Document Reviewed: 02/26/2011  ExitCare Patient Information 2014 [employer].

## 2013-05-21 NOTE — Progress Notes (Signed)
[facility] [address], [place] [address] [phone]  Date:  05/21/2013   Name:  Stephen [name]   DOB:  26-Sep-1955   MRN:  [id number]  PCP:  Chancy Hurter, MD    Chief Complaint: Palpitations   History of Present Illness:  Stephen [name] is a 58 y.o. very pleasant male patient who presents with the following:  Patient with palpitations started about 0100 and lasted til 0900.  No chest pain pressure or ache, an occasional fluttering.  No shortness of breath.  No nausea or vomiting.  Now asymptomatic.  Denies other complaint or health concern today.   Patient Active Problem List   Diagnosis Date Noted  . Other malaise and fatigue 05/14/2013  . IBS 11/04/2006  . COLONIC POLYPS, HX OF 11/04/2006    Past Medical History  Diagnosis Date  . Heart murmur     No past surgical history on file.  History  Substance Use Topics  . Smoking status: Never Smoker   . Smokeless tobacco: Never Used  . Alcohol Use: Yes    No family history on file.  No Known Allergies  Medication list has been reviewed and updated.  Current Outpatient Prescriptions on File Prior to Visit  Medication Sig Dispense Refill  . Multiple Vitamin (MULTIVITAMIN) tablet Take 1 tablet by mouth daily.        . NON FORMULARY OTC - per patient - a lot of Enzymes,amino acids and supplements       . vitamin C (ASCORBIC ACID) 500 MG tablet Take 500 mg by mouth daily.        Marland Kitchen VITAMIN D, CHOLECALCIFEROL, PO Take by mouth daily.        . vitamin E 400 UNIT capsule Take 400 Units by mouth daily.         No current facility-administered medications on file prior to visit.    Review of Systems:  As per HPI, otherwise negative.    Physical Examination: Filed Vitals:   05/21/13 1500  BP: 100/58  Pulse: 69  Temp: 97.9 F (36.6 C)   Filed Vitals:   05/21/13 1500  Height: 5\' 4"  (1.626 m)  Weight: 120 lb (54.432 kg)   Body mass index is 20.59 kg/(m^2). Ideal  Body Weight: Weight in (lb) to have BMI = 25: 145.3  GEN: WDWN, NAD, Non-toxic, A & O x 3 HEENT: Atraumatic, Normocephalic. Neck supple. No masses, No LAD. Ears and Nose: No external deformity. CV: RRR, No M/G/R. No JVD. No thrill. No extra heart sounds. PULM: CTA B, no wheezes, crackles, rhonchi. No retractions. No resp. distress. No accessory muscle use. ABD: S, NT, ND, +BS. No rebound. No HSM. EXTR: No c/c/e NEURO Normal gait.  PSYCH: Normally interactive. Conversant. Not depressed or anxious appearing.  Calm demeanor.    Assessment and Plan: Palpitations Cardiology referral  Signed,  Ellison Carwin, MD

## 2013-05-22 ENCOUNTER — Ambulatory Visit (INDEPENDENT_AMBULATORY_CARE_PROVIDER_SITE_OTHER): Payer: Self-pay

## 2013-05-22 DIAGNOSIS — F4320 Adjustment disorder, unspecified: Secondary | ICD-10-CM

## 2013-05-25 ENCOUNTER — Ambulatory Visit: Payer: Self-pay

## 2013-05-28 ENCOUNTER — Telehealth: Payer: Self-pay | Admitting: Cardiovascular Disease

## 2013-05-28 NOTE — Telephone Encounter (Signed)
New message          Pt would like for michelle to give him a call

## 2013-05-29 ENCOUNTER — Encounter: Payer: Self-pay | Admitting: Hematology and Oncology

## 2013-05-29 ENCOUNTER — Ambulatory Visit (HOSPITAL_BASED_OUTPATIENT_CLINIC_OR_DEPARTMENT_OTHER): Payer: Self-pay | Admitting: Hematology and Oncology

## 2013-05-29 ENCOUNTER — Ambulatory Visit: Payer: Self-pay

## 2013-05-29 VITALS — BP 130/78 | HR 80 | Temp 97.7°F | Resp 20 | Ht 64.0 in | Wt 121.1 lb

## 2013-05-29 DIAGNOSIS — D7589 Other specified diseases of blood and blood-forming organs: Secondary | ICD-10-CM

## 2013-05-29 DIAGNOSIS — D72819 Decreased white blood cell count, unspecified: Secondary | ICD-10-CM

## 2013-05-29 DIAGNOSIS — D696 Thrombocytopenia, unspecified: Secondary | ICD-10-CM

## 2013-05-29 NOTE — Telephone Encounter (Signed)
Attempted to call patient; no answer, no voice mail 

## 2013-05-29 NOTE — Progress Notes (Signed)
[facility] CONSULT NOTE  Patient Care Team: Lisabeth Pick, MD as PCP - General Heath Lark, MD as Consulting Physician (Hematology and Oncology)  CHIEF COMPLAINTS/PURPOSE OF CONSULTATION:  Progressive leukopenia, thrombocytopenia with elevated MCV  HISTORY OF PRESENTING ILLNESS:  Stephen [name] 58 y.o. male is here because of abnormal CBC as above. First of all, the patient declined vital signs and examination as he deemed they will unnecessarily consume time during the consult which could have resulted in the higher level of billing. Hence, the entire interaction with the patient was truly directed by the patient. Please see scanned records related to recent bloodwork. The patient also provided copies of all his blood work dated back to1987. He kept a spreadsheet of results of every single blood test that were ever performed. He also shared with me approximately 3 pages of nutritional supplements which I estimated amounting to approximately 50 different nutritional supplements that he takes on a regular basis. According to the patient, he has been taking all this nutritional supplements for over 30 years and he believes they have very beneficial to his health.  Most of his blood work were done as part of his routine monitoring of his health status. Data back to 1987, his CBC were within normal limits. Over the last few years, he started to have progressive leukopenia and thrombocytopenia. I also noted elevated MCV without signs of anemia. The patient complains of chronic fatigue which he attributed to low testosterone level. He also had several episodes of bronchitis on and off. He estimated 20 pound weight loss over the last 2 years with inability to gain weight no matter how much he eats. The patient denies any recent signs or symptoms of bleeding such as spontaneous epistaxis, hematuria or hematochezia. He denies any recent fever, chills, night sweats or abnormal  weight loss   MEDICAL HISTORY:  Past Medical History  Diagnosis Date  . Heart murmur   . Hypogonadism male   . Pancytopenia     SURGICAL HISTORY: Past Surgical History  Procedure Laterality Date  . Tonsillectomy    . Colonoscopy      SOCIAL HISTORY: History   Social History  . Marital Status: Single    Spouse Name: N/A    Number of Children: N/A  . Years of Education: N/A   Occupational History  . Not on file.   Social History Main Topics  . Smoking status: Never Smoker   . Smokeless tobacco: Never Used  . Alcohol Use: 6.0 oz/week    10 Glasses of wine per week  . Drug Use: No  . Sexual Activity: Not on file   Other Topics Concern  . Not on file   Social History Narrative  . No narrative on file    FAMILY HISTORY: Family History  Problem Relation Age of Onset  . Cancer Father     prostate  . Cancer Maternal Grandmother     myeloma    ALLERGIES:  has No Known Allergies.  MEDICATIONS:  Current Outpatient Prescriptions  Medication Sig Dispense Refill  . Multiple Vitamin (MULTIVITAMIN) tablet Take 8 tablets by mouth daily. Takes 8 tablets by mouth daily to equal a full dose.      . NON FORMULARY OTC Fish oil and Flax oil - per patient - a lot of Enzymes,amino acids and supplements Anti-oxidents Hormone support      . vitamin C (ASCORBIC ACID) 500 MG tablet Take 500 mg by mouth daily.        Marland Kitchen  VITAMIN D, CHOLECALCIFEROL, PO Take by mouth daily.        . vitamin E 400 UNIT capsule Take 400 Units by mouth daily.         No current facility-administered medications for this visit.    REVIEW OF SYSTEMS:   Constitutional: Denies fevers, chills or abnormal night sweats Eyes: Denies blurriness of vision, double vision or watery eyes Ears, nose, mouth, throat, and face: Denies mucositis or sore throat Respiratory: Denies cough, dyspnea or wheezes Cardiovascular: Denies palpitation, chest discomfort or lower extremity swelling Gastrointestinal:  Denies  nausea, heartburn or change in bowel habits Skin: Denies abnormal skin rashes Lymphatics: Denies new lymphadenopathy or easy bruising Neurological:Denies numbness, tingling or new weaknesses Behavioral/Psych: Mood is stable, no new changes  All other systems were reviewed with the patient and are negative.  PHYSICAL EXAMINATION: ECOG PERFORMANCE STATUS: 0 - Asymptomatic  Filed Vitals:   05/29/13 1405  BP: 130/78  Pulse: 80  Temp: 97.7 F (36.5 C)  Resp: 20   Filed Weights   05/29/13 1405  Weight: 121 lb 1.6 oz (54.931 kg)    GENERAL:alert, no distress and comfortable. He looks thin but not cachectic EYES: normal, conjunctiva are pink and non-injected, sclera clear PSYCH: alert & oriented x 3 with fluent speech NEURO: no focal motor/sensory deficits Unable to perform the rest of his examination due to the patient's preference not to be examined.  LABORATORY DATA:  I have reviewed the data as listed Lab Results  Component Value Date   WBC 6.5 04/15/2013   HGB 14.6 04/15/2013   HCT 45.0 04/15/2013   MCV 105.8* 04/15/2013   He brought with him slight dated 05/11/2013 which I have reviewed. There are mature white blood cells seen. No increased blasts. Hypogranular neutrophils were seen with occasional  pseudo-Pelger-Hut abnormality. There are mild macrocytosis seen on the red blood cells. That is absolute reduction in platelet count with no evidence of platelet clumping.  ASSESSMENT & PLAN:  #1 progressive leukopenia with occasional normal white blood cell count #2 macrocytosis without anemia #3 progressive thrombocytopenia with occasional normal platelet count The total white blood cell count and platelet count fluctuated.  On his review of recent blood smear, the absolute white blood cell count and platelets were reduced. For example, on his blood test dated 04/15/2013, the total white cell count were within normal limits. Similarly, the platelet count from an outside  institution dated 05/12/2013 showed a normal platelet count of 149,000.  This process has been going on for many years. He is relatively asymptomatic.  I suspect the cause of the abnormal CBC is likely metabolic in nature. The patient has been drinking alcohol on a regular basis, not to mention ingestion of excessive amounts of nutritional supplements I recommend he discontinues all alcohol and nutritional supplements immediately.  However, the patient is fairly reluctant to stop his nutritional supplements. He explained, being an Chief Financial Officer, he needs to experiment by eliminating one factor at that time to understand the cause of his abnormal CBC.  I told him, the average life span of a red blood cell is approximately 100 days. Currently, he has elevated MCV.  If he stops alcohol intake altogether right now, and if this is truly the only cause of his abnormal CBC, we may not see normalization of his blood work until at least 100 days from now. However, what if his CBC remained abnormal 100 days from now because he is still on nutritional supplements? The patient states that  then he would decide to eliminate his nutritional supplements at that time. I tried to explain to him that it may delay further testing or necessary investigation for pancytopenia. He could have undiagnosed myelodysplastic syndrome.  At the end of our conversation, I do not feel that I could dissuade the patient from his decision. He will have his blood checked as his local laboratory in the near future.  I have not make a return appointment for the patient to come back.  All questions were answered. The patient knows to call the clinic with any problems, questions or concerns. I spent 40 minutes counseling the patient face to face. The total time spent in the appointment was 30 minutes and more than 50% was on counseling.     Heath Lark, MD 05/29/2013 8:35 PM

## 2013-05-29 NOTE — Telephone Encounter (Signed)
Spoke with patient who states he is scheduled to see Dr. Acie Fredrickson tomorrow as a new patient and he would like to make certain that we are aware that this is a medical consult only and patient is bringing records from December 2009 visit with Dr. Dub Amis.  Patient states he called for a cardiology consult due to one episode of palpitations that lasted 8 hours.  I advised patient that he is scheduled for a 30 minute consult with Dr. Acie Fredrickson and that he will discuss his recommendations after a thorough evaluation.  Patient verbalized understanding and agreement.

## 2013-05-29 NOTE — Progress Notes (Signed)
Checked in new patient--he is self pay. See prev notes. I have scanned in info for him

## 2013-05-29 NOTE — Telephone Encounter (Signed)
Follow up  ° ° ° °Returning call back to nurse  °

## 2013-05-30 ENCOUNTER — Encounter: Payer: Self-pay | Admitting: Cardiovascular Disease

## 2013-05-30 ENCOUNTER — Encounter (INDEPENDENT_AMBULATORY_CARE_PROVIDER_SITE_OTHER): Payer: Self-pay

## 2013-05-30 ENCOUNTER — Ambulatory Visit (INDEPENDENT_AMBULATORY_CARE_PROVIDER_SITE_OTHER): Payer: Self-pay | Admitting: Cardiovascular Disease

## 2013-05-30 VITALS — BP 110/70 | HR 70 | Ht 65.0 in | Wt 120.0 lb

## 2013-05-30 DIAGNOSIS — R002 Palpitations: Secondary | ICD-10-CM | POA: Insufficient documentation

## 2013-05-30 DIAGNOSIS — Z8249 Family history of ischemic heart disease and other diseases of the circulatory system: Secondary | ICD-10-CM

## 2013-05-30 NOTE — Assessment & Plan Note (Addendum)
Stephen [name] has a family history of coronary artery disease.   He does not have any CAD and his labs are unremarkable.  His LDL is very low - HDL is very high.    He has seen Dr. Rayford Halsted in the past.  He has had a negative stress echo  And an echo that showed mild aortic valve thickening.    I think his main issue is that of anxiety.   He has no symptoms of CAD - no dyspnea, no CP.  We had a long discussion about the utility of stress testing in the absence of symptoms.  His pre-test possibility of having CAD is very low.  His lipid profile is ideal.  His LDL =  87.  HDL = 100. Total chol is 199. Trigs = 50.     I told him that I did not think that he needed further stress  testing but would rather him start a regular exercise routine and then call us back if he had any problems with his exercise.   At this point, the chances  of a false positive is more likely than a true positive.    He insisted on getting a stress echo.  I told him that I did not think it was necessary and repeated several times that given his normal tests in the past, lack of any cardiac symptoms that suggest ischemia that the  chances of getting a false positive was greater than getting a true positive.    I told him that I would definitely evaluate him further if he had any symptoms of CP or dyspnea while exercising.  I told him that I would not just order any  tests just because the patient requested.  I informed him that I would need to agree with the indication and reason for getting any test or procedure.    Besides, I warned him that the echo and stress echo were very expensive and that he may want to call and check the price before he insists on getting them.      He informed me that he was frustrated and was not getting what he expected from today's visit.    We eventually agreed to disagree.  I told him that I am probably not a good fit for him and that  I do not think that I'm going to be able to help him in the way that he  expects . I gave the names of other cardiologist in the area Dr. Clayton Lefort Dr. Ezzard Standing  I suggested that he call them and see if they would be a better fit.   Ideally, he should find a doctor that really concentrates solely on natural and holistic healing.   We have also suggest that he call Dr. Adriana Simas.

## 2013-05-30 NOTE — Progress Notes (Addendum)
Stephen [name] Date of Birth  22-Mar-1955       [facility]    [employer] [address], [place], [place]  [address]   [place], [place]  [address] [place]   Fax  [phone]     Fax [phone]  Problem List: 1. Palpitations 2. Anxiety  History of Present Illness:  Stephen [name] presents for further evaluation of palpitations.  He complained of 8 hours of palpitations for which he presented to the Urgent Care.    His symptoms are c/w  PVCs .  No dizziness, no CP, no dyspnea.    His ECG at Urgent Care was normal sinus rhythm.   He called the office several days ago and wanted to get attached with a doctor that was familiar with non-traditional medicine.   He was a previous patient of Dr. Rayford [name].   He brought a briefcase full of records for my review.   He has seen Dr. Sondra [name] for years.  Dr. Frankey [name] has done echos and stress echos in the past - all of which are normal.  Dr. Rayford [name] has recommended that Stephen [name] have these tests done every 5 years.    He presented as a consult today to ask [name] to order these tests.  He did not want to have a physical exam.    He has no cardiac complaints today.  He told [name] that he prefers natural foods and suppliments for any treatment and would resist any meds like statins.    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   He denies any stress or dyspena.  He is very anxious - especially with his family history of multiple diseases - both cardiac and noncardiac.     Does not exercise,  Non smoker. Drinks only rarely.    Current Outpatient Prescriptions on File Prior to Visit  Medication Sig Dispense Refill  . Multiple Vitamin (MULTIVITAMIN) tablet Take 8 tablets by mouth daily. Takes 8 tablets by mouth daily to equal a full dose.      . NON FORMULARY OTC Fish oil and Flax oil - per patient - a lot of Enzymes,amino acids and supplements Anti-oxidents  Hormone support      . vitamin C (ASCORBIC ACID) 500 MG tablet Take 500 mg by mouth daily.        Marland Kitchen VITAMIN D, CHOLECALCIFEROL, PO Take by mouth daily.        . vitamin E 400 UNIT capsule Take 400 Units by mouth daily.         No current facility-administered medications on file prior to visit.    No Known Allergies  Past Medical History  Diagnosis Date  . Heart murmur   . Hypogonadism male   . Pancytopenia     Past Surgical History  Procedure Laterality Date  . Tonsillectomy    . Colonoscopy      History  Smoking status  . Never Smoker   Smokeless tobacco  . Never Used    History  Alcohol Use  . 6.0 oz/week  . 10 Glasses of wine per week    Family History  Problem Relation Age of Onset  . Cancer Father     prostate  . Cancer Maternal Grandmother     myeloma    Reviw of Systems:  Reviewed in the HPI.  All other systems are negative.  Physical  Exam: Blood pressure 110/70, pulse 70, height 5\' 4"  (1.626 m), weight 120 lb (54.432 kg). Wt Readings from Last 3 Encounters:  05/30/13 120 lb (54.432 kg)  05/29/13 121 lb 1.6 oz (54.931 kg)  05/21/13 120 lb (54.432 kg)     General:  Thin , middle aged gentleman, in NAD Head: Normocephalic, atraumatic, sclera non-icteric, mucus membranes are moist,  Neck: Supple. Carotids are 2 + without bruits. No JVD  Lungs: Clear   Heart: RR, normal S1S2 Abdomen: Soft, non-tender, non-distended with normal bowel sounds. Msk:  Strength and tone are normal  Extremities: No clubbing or cyanosis. No edema.  Distal pedal pulses are 2+ and equal  Neuro: CN II - XII intact.  Alert and oriented X 3.   Gait is normal Psych:  Normal   ECG: May 15, 2013:  NSR , normal., no ST or T wave changes.   Assessment / Plan:

## 2013-05-30 NOTE — Patient Instructions (Signed)
Call Christen Bame, RN @ [phone] if you choose to proceed with treatment with Dr. Acie Fredrickson Follow up as needed  Dr. Acie Fredrickson recommends the following for your palpitations:  Drink V8 juice daily Get plenty of rest and a good night's sleep  Exercise regularly

## 2013-05-30 NOTE — Assessment & Plan Note (Signed)
Stephen [name] Is having palpitations that clinically somewhat premature ventricular contractions. He's been under lots of stress.  I suggested that he try getting better sleep. I've recommended that he start a regular exercise program. I have also recommended that he drink a can of V8 juice every day.  His most recent labs have been unremarkable.  We'll followup as needed.

## 2013-06-05 ENCOUNTER — Ambulatory Visit (INDEPENDENT_AMBULATORY_CARE_PROVIDER_SITE_OTHER): Payer: Self-pay

## 2013-06-05 DIAGNOSIS — F4320 Adjustment disorder, unspecified: Secondary | ICD-10-CM

## 2013-07-10 ENCOUNTER — Ambulatory Visit: Payer: Self-pay

## 2013-07-10 ENCOUNTER — Ambulatory Visit (INDEPENDENT_AMBULATORY_CARE_PROVIDER_SITE_OTHER): Payer: Self-pay

## 2013-07-10 DIAGNOSIS — F4320 Adjustment disorder, unspecified: Secondary | ICD-10-CM

## 2013-07-17 ENCOUNTER — Other Ambulatory Visit: Payer: Self-pay | Admitting: Emergency Medicine

## 2013-07-17 ENCOUNTER — Telehealth: Payer: Self-pay

## 2013-07-17 DIAGNOSIS — N5089 Other specified disorders of the male genital organs: Secondary | ICD-10-CM

## 2013-07-17 NOTE — Telephone Encounter (Signed)
Dr. Everlene Farrier- Pt has a follow up question in regards to some consultation given privatley during his visit with you. Pt did not want to go into details with me. Please give pt a call back at [phone]

## 2013-07-17 NOTE — Telephone Encounter (Signed)
Pt called in and needs Dr Everlene Farrier to contact him about a follow up question he has. 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  He can be reached @[phone]. Thank you

## 2013-07-17 NOTE — Telephone Encounter (Signed)
Patient called stating he found a mass in his testicle. I advised him that I would make a referral to a urologist for that to do an ultrasound. He is going to make a decision whether he wants to come here and [place] order the ultrasound or whether he would rather see the urologist. He understands this could be a testicular cancer and the need for this to be evaluated as soon as possible.

## 2013-07-24 ENCOUNTER — Ambulatory Visit (INDEPENDENT_AMBULATORY_CARE_PROVIDER_SITE_OTHER): Payer: Self-pay

## 2013-07-24 DIAGNOSIS — F4320 Adjustment disorder, unspecified: Secondary | ICD-10-CM

## 2013-08-09 ENCOUNTER — Ambulatory Visit (INDEPENDENT_AMBULATORY_CARE_PROVIDER_SITE_OTHER): Payer: Self-pay

## 2013-08-09 DIAGNOSIS — F4320 Adjustment disorder, unspecified: Secondary | ICD-10-CM

## 2013-08-21 ENCOUNTER — Telehealth: Payer: Self-pay | Admitting: Internal Medicine

## 2013-08-21 NOTE — Telephone Encounter (Signed)
Pt called and wanted to be scheduled with Dr. Burnice Logan.   Checked with scheduling, Dr.K is not accepting additional transfer patients.  Offered to schedule with Dr.Stephen Reggie Pile or Kela Millin, pt does not want to be referred to anyone else.  Pt wanted to leave a note for Dr. Leanne Chang, offered to re-activate his MyChart account, pt declined.  Stated he would explore additional avenues to get in touch with Dr. Leanne Chang.

## 2013-08-22 NOTE — Telephone Encounter (Signed)
Left message for pt to call back  °

## 2013-08-22 NOTE — Telephone Encounter (Signed)
Pt does not want [place] to do anything right now.  He will call later

## 2013-08-28 ENCOUNTER — Ambulatory Visit (INDEPENDENT_AMBULATORY_CARE_PROVIDER_SITE_OTHER): Payer: Self-pay

## 2013-08-28 ENCOUNTER — Ambulatory Visit: Payer: Self-pay

## 2013-08-28 DIAGNOSIS — F4320 Adjustment disorder, unspecified: Secondary | ICD-10-CM

## 2013-09-04 ENCOUNTER — Ambulatory Visit (INDEPENDENT_AMBULATORY_CARE_PROVIDER_SITE_OTHER): Payer: Self-pay

## 2013-09-04 DIAGNOSIS — F4320 Adjustment disorder, unspecified: Secondary | ICD-10-CM

## 2013-10-16 ENCOUNTER — Ambulatory Visit: Payer: Self-pay

## 2013-10-18 ENCOUNTER — Ambulatory Visit (INDEPENDENT_AMBULATORY_CARE_PROVIDER_SITE_OTHER): Payer: Self-pay

## 2013-10-18 DIAGNOSIS — F348 Other persistent mood [affective] disorders: Secondary | ICD-10-CM

## 2013-12-21 ENCOUNTER — Telehealth: Payer: Self-pay | Admitting: *Deleted

## 2013-12-21 NOTE — Telephone Encounter (Signed)
Pt called wanting to know if Dr Alvy Bimler can help him with his liver health or does he need to go to another specialist? Is wanting to see Dr Alvy Bimler sooner if possible. Would like her email address so he can follow up with her. Note to Dr Alvy Bimler.

## 2013-12-25 ENCOUNTER — Telehealth: Payer: Self-pay | Admitting: Medical Oncology

## 2013-12-25 NOTE — Telephone Encounter (Signed)
Follow-up call to patient from 12/11 patient call to office.  Patient reports to have seen his PCP yesterday and his PCP did not have a recommendation for a liver specialist. Patient asking to speak to Dr. Alvy Bimler over the phone for 5-10 minutes so that she can refer him to a specialist w/in [place] that would be a "good fit" for him since she is familiar with his history. Patient insistent on having return call from MD, does not want to discuss with this RN specifics. Patient states he is following her invitation from his May appt with her and he is "reaching out to her" in regards to his situation/condition. Patient states he is not doing well and is available this afternoon till five to discuss his concerns with her. Informed pt Dr. Alvy Bimler has a full patient schedule today, and will give her this message as he requested.   Also, patient had requested Dr. Calton Dach email address, informed patient emails are only for interoffice mail, per [facility].   Mssg give to MD for review.  LOV 05/29/2013

## 2013-12-28 ENCOUNTER — Telehealth: Payer: Self-pay | Admitting: Hematology and Oncology

## 2013-12-28 NOTE — Telephone Encounter (Signed)
Spoke with patient. His platelet count has resolved. He has persistent abnormal liver function tests and I recommend referral to GI.

## 2014-04-02 ENCOUNTER — Ambulatory Visit (INDEPENDENT_AMBULATORY_CARE_PROVIDER_SITE_OTHER): Payer: Self-pay

## 2014-04-02 DIAGNOSIS — F348 Other persistent mood [affective] disorders: Secondary | ICD-10-CM

## 2014-07-18 ENCOUNTER — Ambulatory Visit (INDEPENDENT_AMBULATORY_CARE_PROVIDER_SITE_OTHER): Payer: Self-pay

## 2014-07-18 DIAGNOSIS — F348 Other persistent mood [affective] disorders: Secondary | ICD-10-CM

## 2014-07-30 ENCOUNTER — Ambulatory Visit (INDEPENDENT_AMBULATORY_CARE_PROVIDER_SITE_OTHER): Payer: Self-pay

## 2014-07-30 DIAGNOSIS — F348 Other persistent mood [affective] disorders: Secondary | ICD-10-CM

## 2014-09-26 ENCOUNTER — Ambulatory Visit (INDEPENDENT_AMBULATORY_CARE_PROVIDER_SITE_OTHER): Payer: Self-pay

## 2014-09-26 DIAGNOSIS — F348 Other persistent mood [affective] disorders: Secondary | ICD-10-CM

## 2015-08-11 ENCOUNTER — Ambulatory Visit (INDEPENDENT_AMBULATORY_CARE_PROVIDER_SITE_OTHER): Payer: Self-pay | Admitting: Physician Assistant

## 2015-08-11 VITALS — BP 146/60 | HR 99 | Temp 97.9°F | Resp 18

## 2015-08-11 DIAGNOSIS — R002 Palpitations: Secondary | ICD-10-CM

## 2015-08-11 NOTE — Progress Notes (Signed)
Subjective:    Patient ID: Stephen [name], male    DOB: 01-30-1955, 60 y.o.   MRN: [id number]  Patient has PMH significant for a heart murmur and has experienced palpitations intermittently since the 1990s. His father's family has a history of cardiovascular disease. He was seeing a Dr. Rayford Halsted, who recommended periodic stress testing and screening for heart disease. He saw Dr. Acie Fredrickson most recently, who told him he did not need a stress test. He was very frustrated over being told he would not need the stress testing, and would like to see another cardiologist. He is also seeing a specialist for his leg swelling which is likely related to some sort of peripheral vascular disease.   Palpitations   This is a chronic problem. The current episode started today. The problem occurs intermittently. Progression since onset: longest run he's ever experienced was today. Episode Length: intermittently lasts seconds. The symptoms are aggravated by stress (Notices that episodes are worse with eating a quarter pounder). Associated symptoms include anxiety. Pertinent negatives include no chest pain, coughing, diaphoresis, dizziness, fever, malaise/fatigue, nausea, near-syncope, numbness, shortness of breath, syncope, vomiting or weakness. Associated symptoms comments: Leg swelling (worse in the RLE).. He has tried nothing for the symptoms. Risk factors include family history, being male and stress.     Review of Systems  Constitutional: Negative for diaphoresis, fever and malaise/fatigue.  Respiratory: Negative for cough and shortness of breath.   Cardiovascular: Positive for palpitations. Negative for chest pain, syncope and near-syncope.  Gastrointestinal: Negative for nausea and vomiting.  Neurological: Negative for dizziness, weakness and numbness.  Psychiatric/Behavioral: The patient is nervous/anxious.      Current Outpatient Prescriptions:  .  diltiazem 2 % GEL, Apply 1 application topically 4  (four) times daily., Disp: , Rfl:  .  Multiple Vitamin (MULTIVITAMIN) tablet, Take 8 tablets by mouth daily. Takes 8 tablets by mouth daily to equal a full dose., Disp: , Rfl:  .  NON FORMULARY, OTC Fish oil and Flax oil - per patient - a lot of Enzymes,amino acids and supplements Anti-oxidents Hormone support, Disp: , Rfl:  .  vitamin C (ASCORBIC ACID) 500 MG tablet, Take 500 mg by mouth daily.  , Disp: , Rfl:  .  VITAMIN D, CHOLECALCIFEROL, PO, Take by mouth daily.  , Disp: , Rfl:  .  vitamin E 400 UNIT capsule, Take 400 Units by mouth daily.  , Disp: , Rfl:   Not on File    Objective:   Physical Exam  Constitutional: He is oriented to person, place, and time. He appears well-developed and well-nourished. He is cooperative. No distress.  Blood pressure (!) 146/60, pulse 99, temperature 97.9 F (36.6 C), temperature source Oral, resp. rate 18, SpO2 99 %.  HENT:  Head: Normocephalic and atraumatic.  Neck: Trachea normal and normal range of motion. Neck supple.  Cardiovascular: Normal rate, regular rhythm, normal heart sounds and intact distal pulses.   +1 pitting edema in RLE  Pulmonary/Chest: Effort normal and breath sounds normal.  Musculoskeletal: Normal range of motion.  Neurological: He is alert and oriented to person, place, and time.  Skin: Skin is warm, dry and intact.  Psychiatric: His speech is normal. Judgment normal. His mood appears anxious. His affect is not inappropriate. Cognition and memory are normal.    EKG was normal with no dropped beats, ST elevation, or arrhythmias. Only change from previous EKG was slightly increased rate.      Assessment & Plan:  1. Palpitations -  EKG 12-Lead - Ambulatory referral to Cardiology - if unable to see another cardiologist in [facility] system, will refer to [facility] to see Dr. Rayford Halsted.  Kelly Rayburn PA-S 08/11/15

## 2015-08-11 NOTE — Patient Instructions (Addendum)
Please call Dr. Cynda Familia office [facility]) Address: [address], [place], [place] [address]  Phone: [phone]      IF you received an x-ray today, you will receive an invoice from [facility]. Please contact [facility] at [phone] with questions or concerns regarding your invoice.   IF you received labwork today, you will receive an invoice from [employer]. Please contact [facility] at [phone] with questions or concerns regarding your invoice.   Our billing staff will not be able to assist you with questions regarding bills from these companies.  You will be contacted with the lab results as soon as they are available. The fastest way to get your results is to activate your My Chart account. Instructions are located on the last page of this paperwork. If you have not heard from [place] regarding the results in 2 weeks, please contact this office.

## 2015-08-11 NOTE — Progress Notes (Signed)
Patient ID: Stephen [name], male    DOB: February 11, 1955, 60 y.o.   MRN: [id number]  PCP: No primary care provider on file.  Subjective:   Chief Complaint  Patient presents with  . Palpitations    today    HPI Presents for evaluation of heart palpitations.  He reports intermittent heart palpitations since the 1990's, but today had an episode that lasted for several hours, which is much longer than usual. For every 4-5 normal beats, he would note a "skip" or pause. No associated chest pain, SOB, HA or dizziness.  He has a strong family history of cardiovascular disease and was previously followed regularly by Dr. Rayford Halsted, who also cared for his father. The patient reports that he was advised to have regular stress testing, but when Dr. Rayford Halsted closed his practice the new cardiologist told him that he didn't need another stress test. The patient became upset and has not followed up. He asks that I contact the cardiologist and "over ride" his decision against stress testing, or refer him to an alternate cardiologist. It is of note that he is also being evaluated by a specialist for vascular disease in the leg.  Chart reviewed. 12/28/2013 phone message from Dr. Alvy Bimler that his platelet count had normalized, but that he had abnormal liver tests and she recommended referral to GI.  July-August/2015 phone messages regarding scheduling with primary care, patient refusing to see other providers in the office.  05/2013 evaluation with Dr. Acie Fredrickson for palpitations. "Bill Is having palpitations that clinically somewhat premature ventricular contractions. He's been under lots of stress.  I suggested that he try getting better sleep. I've recommended that he start a regular exercise program. I have also recommended that he drink a can of V8 juice every day. His most recent labs have been unremarkable. We'll followup as needed." and "Rush Landmark has a family history of coronary artery disease.   He does not  have any CAD and his labs are unremarkable.  His LDL is very low - HDL is very high.    He has seen Dr. Rayford Halsted in the past.  He has had a negative stress echo  And an echo that showed mild aortic valve thickening.    I think his main issue is that of anxiety.   He has no symptoms of CAD - no dyspnea, no CP.  We had a long discussion about the utility of stress testing in the absence of symptoms.  His pre-test possibility of having CAD is very low.  His lipid profile is ideal.  His LDL =  87.  HDL = 100. Total chol is 199. Trigs = 50.   I told him that I did not think that he needed further stress  testing but would rather him start a regular exercise routine and then call us back if he had any problems with his exercise.   At this point, the chances  of a false positive is more likely than a true positive.    He insisted on getting a stress echo.  I told him that I did not think it was necessary and repeated several times that given his normal tests in the past, lack of any cardiac symptoms that suggest ischemia that the  chances of getting a false positive was greater than getting a true positive.    I told him that I would definitely evaluate him further if he had any symptoms of CP or dyspnea while exercising.  I told him that  I would not just order any  tests just because the patient requested.  I informed him that I would need to agree with the indication and reason for getting any test or procedure.    Besides, I warned him that the echo and stress echo were very expensive and that he may want to call and check the price before he insists on getting them.      He informed me that he was frustrated and was not getting what he expected from today's visit.    We eventually agreed to disagree.  I told him that I am probably not a good fit for him and that  I do not think that I'm going to be able to help him in the way that he expects . I gave the names of other cardiologist in the area Dr. Clayton Lefort Dr. Ezzard Standing  I suggested that he call them and see if they would be a better fit."   Review of Systems  Constitutional: Negative for chills and fever.  Respiratory: Negative for cough and shortness of breath.   Cardiovascular: Positive for palpitations. Negative for chest pain and leg swelling.  Gastrointestinal: Negative for diarrhea, nausea and vomiting.  Endocrine: Negative for polydipsia.  Genitourinary: Negative for dysuria, frequency and urgency.  Musculoskeletal: Negative for myalgias.  Skin: Negative for rash.  Neurological: Negative for dizziness and headaches.       Patient Active Problem List   Diagnosis Date Noted  . Palpitations 05/30/2013  . Family history of coronary artery disease 05/30/2013  . Other malaise and fatigue 05/14/2013  . IBS 11/04/2006  . COLONIC POLYPS, HX OF 11/04/2006     Prior to Admission medications   Medication Sig Start Date End Date Taking? Authorizing Provider  diltiazem 2 % GEL Apply 1 application topically 4 (four) times daily.   Yes Historical Provider, MD  Multiple Vitamin (MULTIVITAMIN) tablet Take 8 tablets by mouth daily. Takes 8 tablets by mouth daily to equal a full dose.   Yes Historical Provider, MD  NON FORMULARY OTC Fish oil and Flax oil - per patient - a lot of Enzymes,amino acids and supplements Anti-oxidents Hormone support   Yes Historical Provider, MD  vitamin C (ASCORBIC ACID) 500 MG tablet Take 500 mg by mouth daily.     Yes Historical Provider, MD  VITAMIN D, CHOLECALCIFEROL, PO Take by mouth daily.     Yes Historical Provider, MD  vitamin E 400 UNIT capsule Take 400 Units by mouth daily.     Yes Historical Provider, MD     No Known Allergies     Objective:  Physical Exam  Constitutional: He is oriented to person, place, and time. He appears well-developed and well-nourished. He is active and cooperative. No distress.  BP (!) 146/60 (BP Location: Right Arm, Patient Position: Sitting, Cuff  Size: Normal)   Pulse 99   Temp 97.9 F (36.6 C) (Oral)   Resp 18   SpO2 99%   HENT:  Head: Normocephalic and atraumatic.  Right Ear: Hearing normal.  Left Ear: Hearing normal.  Eyes: Conjunctivae are normal. No scleral icterus.  Neck: Normal range of motion. Neck supple. No thyromegaly present.  Cardiovascular: Normal rate, regular rhythm and normal heart sounds.   Pulses:      Radial pulses are 2+ on the right side, and 2+ on the left side.  Pulmonary/Chest: Effort normal and breath sounds normal.  Lymphadenopathy:       Head (right side):  No tonsillar, no preauricular, no posterior auricular and no occipital adenopathy present.       Head (left side): No tonsillar, no preauricular, no posterior auricular and no occipital adenopathy present.    He has no cervical adenopathy.       Right: No supraclavicular adenopathy present.       Left: No supraclavicular adenopathy present.  Neurological: He is alert and oriented to person, place, and time. No sensory deficit.  Skin: Skin is warm, dry and intact. No rash noted. No cyanosis or erythema. Nails show no clubbing.  Psychiatric: His speech is normal and behavior is normal. Judgment and thought content normal. His mood appears anxious. His affect is not angry, not blunt, not labile and not inappropriate. Cognition and memory are normal. He does not exhibit a depressed mood.  He frequently interrupts me during history and exam, and then while providing patient instructions. Also repeatedly asked me to repeat instructions, stating "I wasn't listening."    EKG reveals NSR. No dysrhythmia, ischemia or LVH. Compared to a previous tracing, there are no significant changes, though the rate today is faster.       Assessment & Plan:   1. Palpitations No palpitations appreciated on today's exam, though the patient reports having them during examination. Normal EKG is reassuring. I suspect that the symptoms are anxiety-related, but I believe  that he needs a Holter/Event monitor, and needs to see cardiology for that. He prefers to stay within [facility], for financial reasons, but isn't willing to see the same cardiologist he saw there in the past. If the practice policy is that he must stay with that person, he prefers to see Dr. Tonna Boehringer, with whom he had developed a good rapport and trusting patient-provider relationship in the past. Alternatives would be Dr. Adrian Prows or Dr. Wynonia Lawman, as noted in Dr. Elmarie Shiley note. - EKG 12-Lead - Ambulatory referral to Cardiology   Fara Chute, PA-C Physician Assistant-Certified [facility]

## 2015-08-12 ENCOUNTER — Telehealth: Payer: Self-pay

## 2015-08-12 NOTE — Telephone Encounter (Signed)
We have referred the patient to cardiology [facility] if he can see someone other than Dr. Acie Fredrickson, if not, Dr. Rayford Halsted at [facility]).   If the symptoms have recurred, and persist, or if there is any associated chest pain or shortness of breath, he should go to the emergency department.

## 2015-08-12 NOTE — Telephone Encounter (Signed)
Patient stated he need to speak with Chelle about a Cardiologist referral. Patient request to speak with Renay or Chelle. Patient is having same symptoms as yesterday. [phone].

## 2015-08-13 ENCOUNTER — Telehealth: Payer: Self-pay

## 2015-08-13 NOTE — Telephone Encounter (Signed)
Other cardiology practices in the area:  [facility] [address], [place], [place] [address]  [phone]   Dr. Landry Corporal [place], [place] [address]  [phone]   [facility] [address] [place], [place] [address]  Phone: [phone]  [facility] [address] [place],  [address]  Phone: [phone]

## 2015-08-13 NOTE — Telephone Encounter (Signed)
Patient called regarding his palpitations.  He states they have become worse and [facility] only wants to set up an appointment with a doctor that he does not want to see.  I advised patient to go to ER or return here.  He states that he has no income and can not come back here right now.  I advised patient that he could be seen at ER and they would bill him and he could set up a payment arrangement.  Told patient that [facility] the [facility] and that would be the best facility for him to go to.  He states that he does not want to go to ER.  He wants to know the names of other Cardiovascular facilities in [place] if possible to be referred to one of them.  Please advise.

## 2015-08-13 NOTE — Telephone Encounter (Signed)
Can you see if we can get pt in sooner with one of these facilities?

## 2015-08-18 ENCOUNTER — Telehealth: Payer: Self-pay

## 2015-08-18 NOTE — Telephone Encounter (Signed)
paps continue and are showing up on the BP devise   Please call [phone]

## 2015-08-19 NOTE — Telephone Encounter (Signed)
Spoke with pt and he is still concerned with the palpations he is having. I spoke with Dr. Everlene Farrier because pt insisted to speak with him. I explained the situation expressing the pt's concern. He just wants to know if he will be able to wait until 9/15 to see the cardiologist.I spoke with Dr. Everlene Farrier and he stated he could not make that decision without seeing the patient himself although he did agree with Chelle's plan. I advised pt to come in to see Dr. Everlene Farrier tomorrow morning to discuss. He refused saying he was too overwhelmed to come in and just wanted someone to answer his question. According to his chart, his EKG is normal and there is nothing showing he is at risk for any heart related problems. Pt insisted something else is the underlying problem and got really upset when I said it could be all the anxiety of this problem causing it. We ended the conversation but I advised him that we are here for him but we have to do this the right way. Pt agreed and he will set up to have his bloodwork done because he states he needs all of this work up before he can see a cardiologist. I advised him to do whatever he has to do to get an answer about his condition. FYI

## 2015-08-22 ENCOUNTER — Telehealth: Payer: Self-pay

## 2015-08-22 NOTE — Telephone Encounter (Signed)
Pt called and made aware that he is to no longer be a patient of this practice.  Left patient with the following information for other cardiology practices in the area:  [facility] [address], [place], [place] [address]  [phone]     Dr. Landry Corporal [place], [place] [address]  [phone]     [facility] [address] [place],  [address]  Phone: [phone]    [facility] [address] [place],  [address]  Phone: [phone]

## 2015-08-22 NOTE — Telephone Encounter (Signed)
FYI

## 2015-08-25 NOTE — Progress Notes (Unsigned)
Letter of dismissal signed and sent to HIM Department via interoffice mail.

## 2015-08-25 NOTE — Telephone Encounter (Signed)
LATE ENTRY: 08/22/15 @ 1100  Both Dr. Acie Fredrickson and Dr. Marlou Porch agree that pt should be discharged from this practice for refusing to accept physicians' recommendations and follow-up with other cardiologists in the area.

## 2015-08-25 NOTE — Telephone Encounter (Signed)
Patient Dismissal Letter signed by Dr. Acie Fredrickson and sent to HIM Department via interoffice mail.

## 2015-08-25 NOTE — Progress Notes (Signed)
Letter sent to HIM Department via interoffice mail.

## 2015-08-26 ENCOUNTER — Telehealth: Payer: Self-pay | Admitting: Cardiovascular Disease

## 2015-08-26 NOTE — Telephone Encounter (Signed)
Patient dismissed from [facility] by Mertie Moores MD, effective August 22, 2015. Dismissal letter sent out by certified / registered mail.  DAJ

## 2015-10-07 ENCOUNTER — Ambulatory Visit: Payer: Self-pay | Admitting: Cardiology

## 2016-01-14 ENCOUNTER — Ambulatory Visit (INDEPENDENT_AMBULATORY_CARE_PROVIDER_SITE_OTHER): Payer: Self-pay | Admitting: Emergency Medicine

## 2016-01-14 ENCOUNTER — Encounter: Payer: Self-pay | Admitting: Emergency Medicine

## 2016-01-14 VITALS — BP 118/68 | HR 58 | Temp 98.6°F | Resp 16 | Ht 65.0 in | Wt 120.0 lb

## 2016-01-14 DIAGNOSIS — R509 Fever, unspecified: Secondary | ICD-10-CM

## 2016-01-14 DIAGNOSIS — R002 Palpitations: Secondary | ICD-10-CM

## 2016-01-14 DIAGNOSIS — J069 Acute upper respiratory infection, unspecified: Secondary | ICD-10-CM

## 2016-01-14 MED ORDER — AZITHROMYCIN 250 MG PO TABS: ORAL_TABLET | ORAL | 0 refills | Status: DC

## 2016-01-14 NOTE — Patient Instructions (Addendum)
Upper Respiratory Infection, Adult Most upper respiratory infections (URIs) are caused by a virus. A URI affects the nose, throat, and upper air passages. The most common type of URI is often called "the common cold." Follow these instructions at home:  Take medicines only as told by your doctor.  Gargle warm saltwater or take cough drops to comfort your throat as told by your doctor.  Use a warm mist humidifier or inhale steam from a shower to increase air moisture. This may make it easier to breathe.  Drink enough fluid to keep your pee (urine) clear or pale yellow.  Eat soups and other clear broths.  Have a healthy diet.  Rest as needed.  Go back to work when your fever is gone or your doctor says it is okay.  You may need to stay home longer to avoid giving your URI to others.  You can also wear a face mask and wash your hands often to prevent spread of the virus.  Use your inhaler more if you have asthma.  Do not use any tobacco products, including cigarettes, chewing tobacco, or electronic cigarettes. If you need help quitting, ask your doctor. Contact a doctor if:  You are getting worse, not better.  Your symptoms are not helped by medicine.  You have chills.  You are getting more short of breath.  You have brown or red mucus.  You have yellow or brown discharge from your nose.  You have pain in your face, especially when you bend forward.  You have a fever.  You have puffy (swollen) neck glands.  You have pain while swallowing.  You have white areas in the back of your throat. Get help right away if:  You have very bad or constant:  Headache.  Ear pain.  Pain in your forehead, behind your eyes, and over your cheekbones (sinus pain).  Chest pain.  You have long-lasting (chronic) lung disease and any of the following:  Wheezing.  Long-lasting cough.  Coughing up blood.  A change in your usual mucus.  You have a stiff neck.  You have  changes in your:  Vision.  Hearing.  Thinking.  Mood. This information is not intended to replace advice given to you by your health care provider. Make sure you discuss any questions you have with your health care provider. Document Released: 06/16/2007 Document Revised: 08/31/2015 Document Reviewed: 04/04/2013 Elsevier Interactive Patient Education  2017 [employer] Palpitations Introduction A palpitation is the feeling that your heart:  Has an uneven (irregular) heartbeat.  Is beating faster than normal.  Is fluttering.  Is skipping a beat. This is usually not a serious problem. In some cases, you may need more medical tests. Follow these instructions at home:  Avoid:  Caffeine in coffee, tea, soft drinks, diet pills, and energy drinks.  Chocolate.  Alcohol.  Do not use any tobacco products. These include cigarettes, chewing tobacco, and e-cigarettes. If you need help quitting, ask your doctor.  Try to reduce your stress. These things may help:  Yoga.  Meditation.  Physical activity. Swimming, jogging, and walking are good choices.  A method that helps you use your mind to control things in your body, like heartbeats (biofeedback).  Get plenty of rest and sleep.  Take over-the-counter and prescription medicines only as told by your doctor.  Keep all follow-up visits as told by your doctor. This is important. Contact a doctor if:  Your heartbeat is still fast or uneven after 24 hours.  Your palpitations occur more often. Get help right away if:  You have chest pain.  You feel short of breath.  You have a very bad headache.  You feel dizzy.  You pass out (faint). This information is not intended to replace advice given to you by your health care provider. Make sure you discuss any questions you have with your health care provider. Document Released: 10/07/2007 Document Revised: 06/05/2015 Document Reviewed: 09/12/2014  2017 Elsevier

## 2016-01-14 NOTE — Progress Notes (Signed)
Stephen [name] 61 y.o.   Chief Complaint  Patient presents with  . palpitations    x 3-4 days  . Fever    pt. says had a fever last night    HISTORY OF PRESENT ILLNESS: This is a 61 y.o. male complaining of increased frequency of palpitations the past 24 hours; has been sick with URI symptoms since last Sunday; coughing and congested. Had fever; has h/o palpitations. Denies SOB, chest pain, syncope, dizziness, or any other significant symptoms.  HPI   Prior to Admission medications   Medication Sig Start Date End Date Taking? Authorizing Provider  Multiple Vitamin (MULTIVITAMIN) tablet Take 8 tablets by mouth daily. Takes 8 tablets by mouth daily to equal a full dose.   Yes Historical Provider, MD  vitamin C (ASCORBIC ACID) 500 MG tablet Take 500 mg by mouth daily.     Yes Historical Provider, MD  VITAMIN D, CHOLECALCIFEROL, PO Take by mouth daily.     Yes Historical Provider, MD  azithromycin (ZITHROMAX) 250 MG tablet Sig as indicated. 01/14/16   Horald Pollen, MD  diltiazem 2 % GEL Apply 1 application topically 4 (four) times daily.    Historical Provider, MD  NON FORMULARY OTC Fish oil and Flax oil - per patient - a lot of Enzymes,amino acids and supplements Anti-oxidents Hormone support    Historical Provider, MD  vitamin E 400 UNIT capsule Take 400 Units by mouth daily.      Historical Provider, MD    No Known Allergies  Patient Active Problem List   Diagnosis Date Noted  . Palpitations 05/30/2013  . Family history of coronary artery disease 05/30/2013  . Other malaise and fatigue 05/14/2013  . IBS 11/04/2006  . COLONIC POLYPS, HX OF 11/04/2006    Past Medical History:  Diagnosis Date  . Heart murmur   . Hypogonadism male   . Pancytopenia [facility])     Past Surgical History:  Procedure Laterality Date  . COLONOSCOPY    . TONSILLECTOMY      Social History   Social History  . Marital status: Single    Spouse name: N/A  . Number of children: N/A  .  Years of education: N/A   Occupational History  . Not on file.   Social History Main Topics  . Smoking status: Never Smoker  . Smokeless tobacco: Never Used  . Alcohol use 6.0 oz/week    10 Glasses of wine per week  . Drug use: No  . Sexual activity: Not on file   Other Topics Concern  . Not on file   Social History Narrative  . No narrative on file    Family History  Problem Relation Age of Onset  . Cancer Father     prostate  . Cancer Maternal Grandmother     myeloma     Review of Systems  Constitutional: Positive for fever.  HENT: Positive for congestion and sore throat.   Eyes: Negative.   Respiratory: Positive for cough. Negative for shortness of breath and wheezing.   Cardiovascular: Positive for palpitations. Negative for chest pain, claudication and leg swelling.  Gastrointestinal: Negative for abdominal pain, blood in stool, diarrhea, melena, nausea and vomiting.  Genitourinary: Negative.   Musculoskeletal: Negative.   Skin: Negative.   Neurological: Negative.   Endo/Heme/Allergies: Negative.   Psychiatric/Behavioral: Negative.   All other systems reviewed and are negative.  Vitals:   01/14/16 1111  BP: 118/68  Pulse: (!) 58  Resp: 16  Temp:  98.6 F (37 C)     Physical Exam  Constitutional: He is oriented to person, place, and time. He appears well-developed and well-nourished.  HENT:  Head: Normocephalic and atraumatic.  Nose: Nose normal.  Mouth/Throat: Oropharynx is clear and moist.  Eyes: Conjunctivae and EOM are normal. Pupils are equal, round, and reactive to light.  Neck: Normal range of motion. Neck supple.  Cardiovascular: Normal rate, regular rhythm, normal heart sounds and intact distal pulses.  Exam reveals no gallop.   No murmur heard. +PAC's heard  Pulmonary/Chest: Effort normal and breath sounds normal.  Abdominal: Soft. There is no tenderness.  Musculoskeletal: Normal range of motion.  Neurological: He is alert and oriented  to person, place, and time.  Skin: Skin is warm and dry. Capillary refill takes less than 2 seconds.  Psychiatric: He has a normal mood and affect. His behavior is normal.  Vitals reviewed.    ASSESSMENT & PLAN: Stephen [name] was seen today for palpitations and fever.  Diagnoses and all orders for this visit:  Palpitations -     Ambulatory referral to Cardiology -     Care order/instruction:  Fever, unspecified fever cause  Acute upper respiratory infection  Other orders -     azithromycin (ZITHROMAX) 250 MG tablet; Sig as indicated.    Patient Instructions  Upper Respiratory Infection, Adult Most upper respiratory infections (URIs) are caused by a virus. A URI affects the nose, throat, and upper air passages. The most common type of URI is often called "the common cold." Follow these instructions at home:  Take medicines only as told by your doctor.  Gargle warm saltwater or take cough drops to comfort your throat as told by your doctor.  Use a warm mist humidifier or inhale steam from a shower to increase air moisture. This may make it easier to breathe.  Drink enough fluid to keep your pee (urine) clear or pale yellow.  Eat soups and other clear broths.  Have a healthy diet.  Rest as needed.  Go back to work when your fever is gone or your doctor says it is okay.  You may need to stay home longer to avoid giving your URI to others.  You can also wear a face mask and wash your hands often to prevent spread of the virus.  Use your inhaler more if you have asthma.  Do not use any tobacco products, including cigarettes, chewing tobacco, or electronic cigarettes. If you need help quitting, ask your doctor. Contact a doctor if:  You are getting worse, not better.  Your symptoms are not helped by medicine.  You have chills.  You are getting more short of breath.  You have brown or red mucus.  You have yellow or brown discharge from your nose.  You have pain in  your face, especially when you bend forward.  You have a fever.  You have puffy (swollen) neck glands.  You have pain while swallowing.  You have white areas in the back of your throat. Get help right away if:  You have very bad or constant:  Headache.  Ear pain.  Pain in your forehead, behind your eyes, and over your cheekbones (sinus pain).  Chest pain.  You have long-lasting (chronic) lung disease and any of the following:  Wheezing.  Long-lasting cough.  Coughing up blood.  A change in your usual mucus.  You have a stiff neck.  You have changes in your:  Vision.  Hearing.  Thinking.  Mood.  This information is not intended to replace advice given to you by your health care provider. Make sure you discuss any questions you have with your health care provider. Document Released: 06/16/2007 Document Revised: 08/31/2015 Document Reviewed: 04/04/2013 Elsevier Interactive Patient Education  2017 Elsevier Inc. Palpitations Introduction A palpitation is the feeling that your heart:  Has an uneven (irregular) heartbeat.  Is beating faster than normal.  Is fluttering.  Is skipping a beat. This is usually not a serious problem. In some cases, you may need more medical tests. Follow these instructions at home:  Avoid:  Caffeine in coffee, tea, soft drinks, diet pills, and energy drinks.  Chocolate.  Alcohol.  Do not use any tobacco products. These include cigarettes, chewing tobacco, and e-cigarettes. If you need help quitting, ask your doctor.  Try to reduce your stress. These things may help:  Yoga.  Meditation.  Physical activity. Swimming, jogging, and walking are good choices.  A method that helps you use your mind to control things in your body, like heartbeats (biofeedback).  Get plenty of rest and sleep.  Take over-the-counter and prescription medicines only as told by your doctor.  Keep all follow-up visits as told by your doctor.  This is important. Contact a doctor if:  Your heartbeat is still fast or uneven after 24 hours.  Your palpitations occur more often. Get help right away if:  You have chest pain.  You feel short of breath.  You have a very bad headache.  You feel dizzy.  You pass out (faint). This information is not intended to replace advice given to you by your health care provider. Make sure you discuss any questions you have with your health care provider. Document Released: 10/07/2007 Document Revised: 06/05/2015 Document Reviewed: 09/12/2014  2017 Elsevier      Agustina Caroli, MD [facility]

## 2016-01-15 ENCOUNTER — Telehealth: Payer: Self-pay

## 2016-01-15 NOTE — Telephone Encounter (Signed)
Pt.states he is feeling like he has a good productive cough now, denies sob, chest pain or fever.  Asking when to take antibiotic and advised if no improvement in a few days. If new chest pain or sob or wheeze come back and see [place].

## 2016-01-15 NOTE — Addendum Note (Signed)
Addended by: Virgia Land on: 01/15/2016 04:15 PM   Modules accepted: Orders

## 2016-01-15 NOTE — Telephone Encounter (Signed)
I suggest to wait 24-48 more hours before starting the antibiotic if no fever and/or additional symptoms. Thanks.

## 2016-01-15 NOTE — Telephone Encounter (Signed)
Pt called back and wanted his referral to cardiology cancelled because he wants to interview different cardiologists including dr Gwendolyn Fill before he schedules and does not want his private info in a work que. I cancelled it. He states after he picks a cardiologist he will call us to re instate the referral to specific cardiologist, he is in this process now. He also verified with me there is a hippa note on his face of chart stating no information is to be released w/o his consent.

## 2016-01-15 NOTE — Telephone Encounter (Signed)
Pt was seen yesterday by Dr. Mitchel Honour and said he was seen really fast an not sure when to pick up the medication he was prescribed he is very anxious to get a call back today so he can go get meds today if necessary.  Pt is needing to make sure that a provider or someone that is familiar with Dr. Barry Brunner way of practicing to call him back   Best number [phone]

## 2016-01-17 NOTE — Telephone Encounter (Signed)
Pt aware.

## 2016-01-20 ENCOUNTER — Telehealth: Payer: Self-pay

## 2016-01-20 ENCOUNTER — Telehealth: Payer: Self-pay | Admitting: *Deleted

## 2016-01-20 NOTE — Telephone Encounter (Signed)
Patient is returning a call. Please advise [phone]

## 2016-01-20 NOTE — Telephone Encounter (Signed)
See next note

## 2016-01-20 NOTE — Telephone Encounter (Signed)
Stephen [name] - pt called back saying that he has a few additional questions about his referral.  [phone]

## 2016-01-20 NOTE — Telephone Encounter (Signed)
Looking for a new pcp and I recommended dr Carlota Raspberry   Needs tsh meds, testosterone and diltiazem /lido cream .  Will establish care soon.

## 2016-01-20 NOTE — Telephone Encounter (Signed)
lmtcb

## 2016-02-26 IMAGING — CR DG CHEST 2V
2 series · 2 of 2 positions shown · non-contrast
Comparison: None.

CLINICAL DATA: Cough, congestion, shortness of breath

EXAM:
CHEST  2 VIEW

[PA]
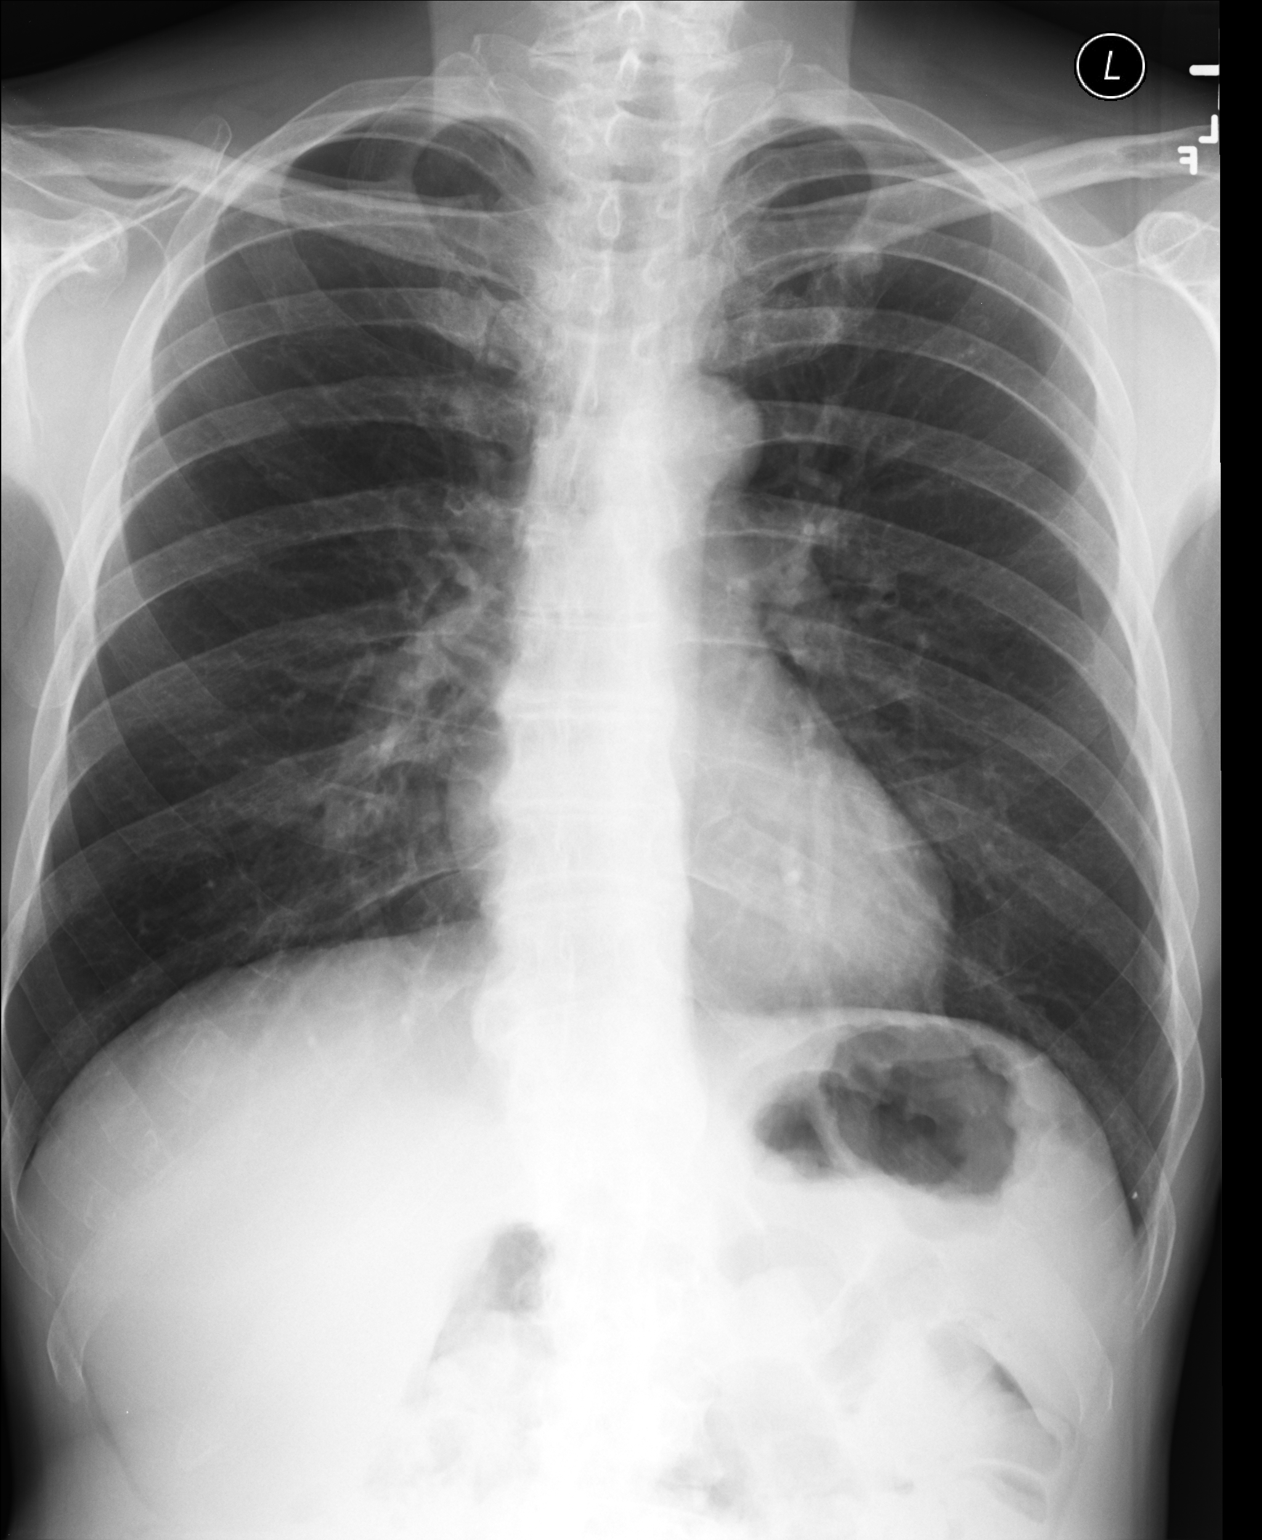

[lateral]
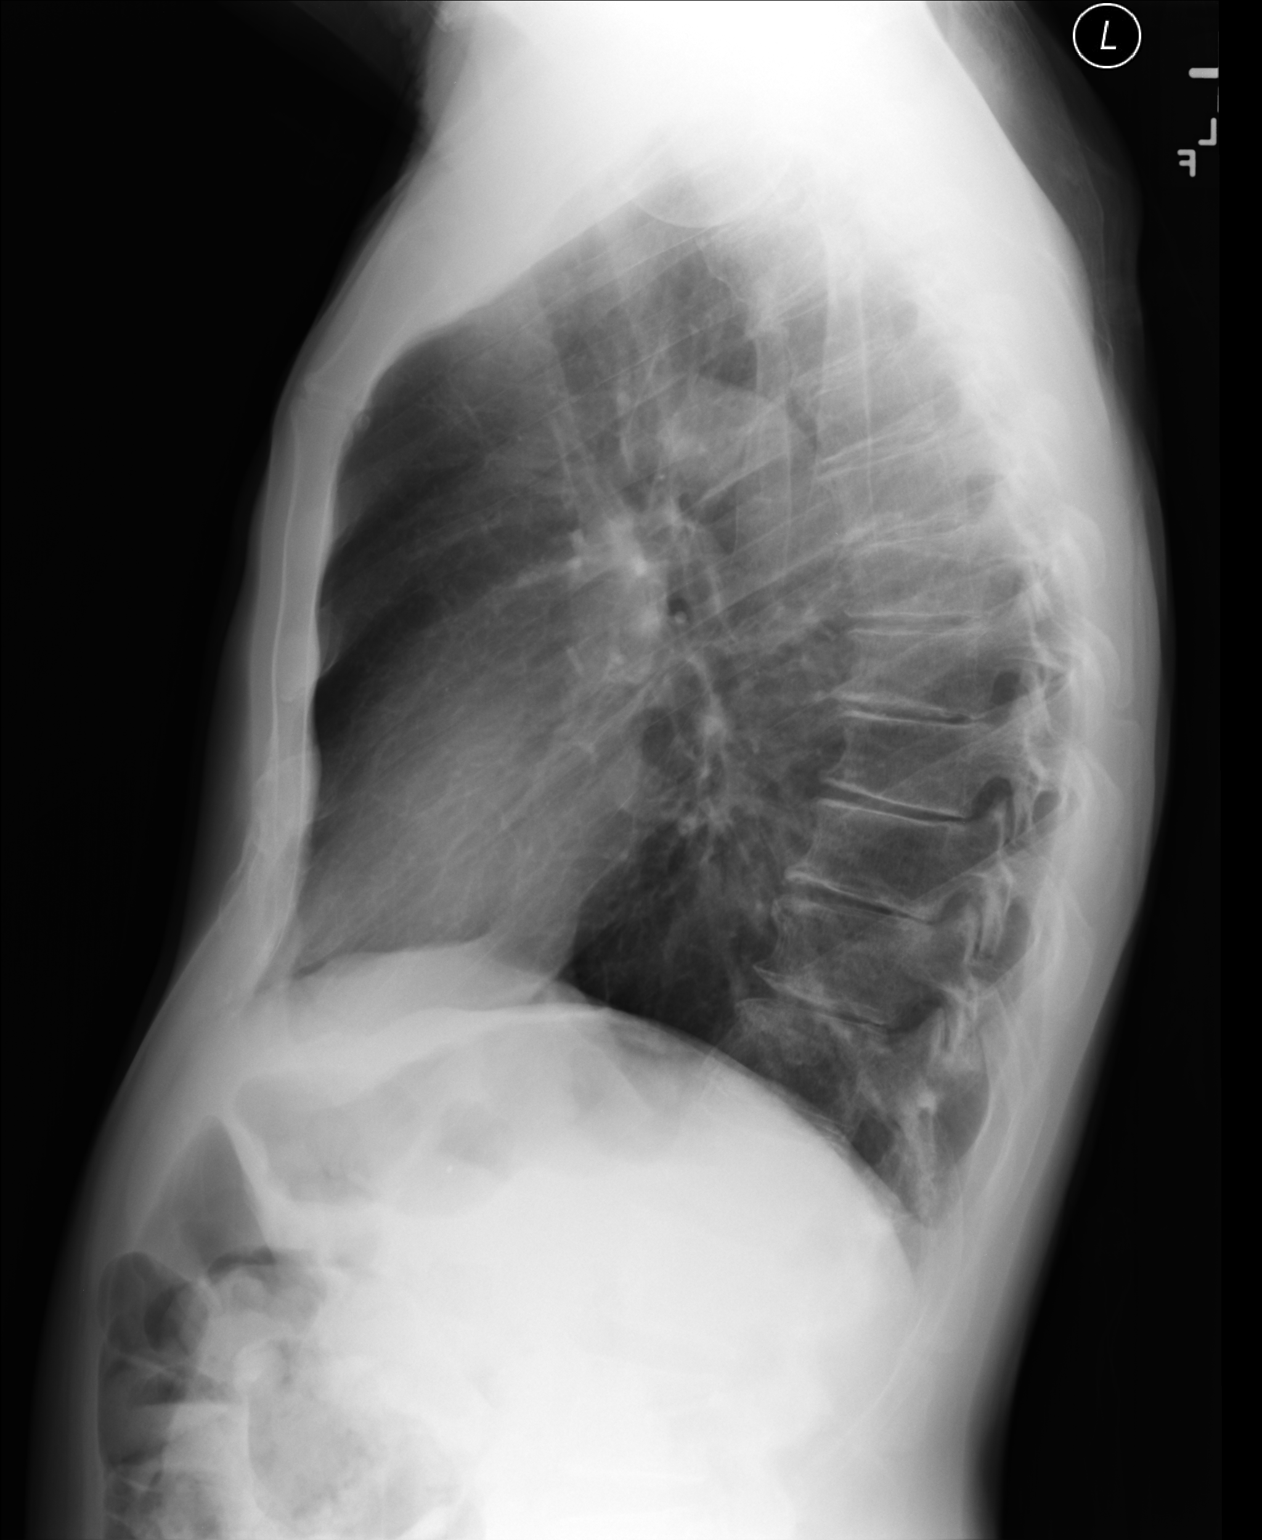

[2 of 2 positions shown; findings below may reference images not displayed]

FINDINGS: Cardiomediastinal silhouette is unremarkable. No acute infiltrate or
pleural effusion. No pulmonary edema. Degenerative changes thoracic
spine. There is focal somewhat nodular density along left first rib.
Although this may be due to a bone island within rib I cannot
exclude a lung nodule. Follow-up CT scan is recommended as
clinically warranted. Another option would be a follow-up x-ray in 2
months to assure stability.
IMPRESSION: No pulmonary edema. Degenerative changes thoracic spine. There is
focal somewhat nodular density along left first rib. Although this
may be due to a bone island within rib I cannot exclude a lung
nodule. Follow-up CT scan is recommended as clinically warranted.
Another option would be a follow-up x-ray in 2 months to assure
stability.

## 2016-04-06 ENCOUNTER — Ambulatory Visit (INDEPENDENT_AMBULATORY_CARE_PROVIDER_SITE_OTHER): Payer: Self-pay | Admitting: Family Medicine

## 2016-04-06 VITALS — BP 122/78 | HR 68 | Temp 98.4°F | Resp 16 | Ht 65.0 in | Wt 114.5 lb

## 2016-04-06 DIAGNOSIS — Z8719 Personal history of other diseases of the digestive system: Secondary | ICD-10-CM

## 2016-04-06 DIAGNOSIS — E039 Hypothyroidism, unspecified: Secondary | ICD-10-CM

## 2016-04-06 DIAGNOSIS — K625 Hemorrhage of anus and rectum: Secondary | ICD-10-CM

## 2016-04-06 DIAGNOSIS — K649 Unspecified hemorrhoids: Secondary | ICD-10-CM

## 2016-04-06 DIAGNOSIS — E041 Nontoxic single thyroid nodule: Secondary | ICD-10-CM

## 2016-04-06 MED ORDER — BETAMETHASONE DIPROPIONATE 0.05 % EX CREA: TOPICAL_CREAM | Freq: Two times a day (BID) | CUTANEOUS | 0 refills | Status: DC

## 2016-04-06 NOTE — Progress Notes (Addendum)
Subjective:  By signing my name below, I, Stephen [name], attest that this documentation has been prepared under the direction and in the presence of [name], MD Electronically Signed: Ladene Artist, ED Scribe 04/06/2016 at 8:27 AM.  Upon entering room - patient declined having scribe present - some initial notes prepared prior to room entry with assistance of scribe, remainder of note by myself.     Patient ID: Stephen [name], male    DOB: 15-Feb-1955, 61 y.o.   MRN: [id number]  Chief Complaint  Patient presents with  . Medication Refill   HPI Stephen [name] is a 61 y.o. male who presents to Primary Care at [facility] to establish care with me/meet new provider, as well as discuss some med refills. Prior primary provider Sanda Klein, NP.  She has reportedly closed her practice. He has looked into other practices locally, including integrative medicine practices. He does note a history of identity theft, so opts out of Care Everywhere. For any referrals, he requests to call their office first to discuss policies instead of sending referral.   He has been working with a holistic medical group in [place].  Has medical consultants including [facility] in [facility], but they can not treat him as out of state and not able to come in for office visit. Has all bloodwork done through [facility] (drawn through [facility]). When tests needed has drawn there.   Palpitations  per chart review, previously seen by cardiologist Dr. 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 Previous negative stress echo but family h/o of CAD. Dismissed from [place] 08/22/15. Still has some palpitations, 1-2 hours at a time, sometimes less, intermittent. Notes more with stress. No chest pains. Not much of a change in these symptoms.   Leukopenia He was seen by Dr. Alvy Bimler in the past,  thought to be metabolic with the amount of nutritional supplements he was taking and some  alcohol at that time. Reccommended discontinuing supplements and alcohol with recheck as needed as he planned on having blood work done on his own.  He states all these levels are ok with adjusting his meds/supplements and has had blood work through [facility].   Hypothyroid: Maternal FH of thyroid disease.  No history of thyroid surgery. Has been told that he had benign nodules on thyroid ultrasounds.  Per his provided records - [place] 08/01/13: "stable solid nodule mid right thyroid lobe measuring 93mm. possible slight increase in size of mainly cystic nodule in left aspect of thyroid isthmus measuring up to 6mm". TSH 2.030 on 03/18/16. Takes nature-thyroid 1 grain per day. On that dose for past 3 months. Initially on 1/2 grain in 2015. Never had thyroid biopsies. Does admit to weight fluctuations. Denies diaphoresis, fever.   Rectal bleeding, rectal/anal pain:  History of hemorrhoids and anal fissures, similar issue over 10-15 years. Recently uses compounded 4% Diltiazem gel and lidocaine 10% gel, applied as needed. Prior on 2% diltiazem gel. Does use 10% lidocaine ointment when only painful in area and not wanting to use diltiazem. Also betamethasone 0.05% cream for when hemorrhoids flair only, but only used 1-2 times per month past 6 months (refill requested today). Has been applying cream inside the rectum - 1/2 to 3/4 inch inside. Had some form of rectal procedure (Keesey method - electrical current, about 2001) History of hemorrhoids, anal fissures for 15 years. He feels like he had had the same problem during that time. Uses  topical treatments up to 3-4 times per day when bleeding noted. Notes more problems when constipated. Last colonoscopy in 2007, reportedly found benign polyps, but he stated that gastorenterologist recommended recheck in 3 years.  Has not scheduled that follow up, and refuses colonoscopy at this time. Not sure if he would have this done in future. Leery of hospitals and surgery. On  discussion, understands risk of potential undiagnosed or delay in diagnosis of colon cancer, and he is aware of elevated AFP on labs from March 8th.  Denies new lesions in rectal area. No lesions on outside. States lesions are on the wall of the sphincter.   On patients provided copy of labs from 03/18/16. Noted elevated testosterone of 1029, and elevated AFP 12.6.   Patient Active Problem List   Diagnosis Date Noted  . Palpitations 05/30/2013  . Family history of coronary artery disease 05/30/2013  . Other malaise and fatigue 05/14/2013  . IBS 11/04/2006  . COLONIC POLYPS, HX OF 11/04/2006   Past Medical History:  Diagnosis Date  . Heart murmur   . Hypogonadism male   . Pancytopenia [facility])    Past Surgical History:  Procedure Laterality Date  . COLONOSCOPY    . TONSILLECTOMY     No Known Allergies Prior to Admission medications   Medication Sig Start Date End Date Taking? Authorizing Provider  azithromycin (ZITHROMAX) 250 MG tablet Sig as indicated. Patient not taking: Reported on 04/06/2016 01/14/16   Horald Pollen, MD  diltiazem 2 % GEL Apply 1 application topically 4 (four) times daily.    Historical Provider, MD  Multiple Vitamin (MULTIVITAMIN) tablet Take 8 tablets by mouth daily. Takes 8 tablets by mouth daily to equal a full dose.    Historical Provider, MD  NON FORMULARY OTC Fish oil and Flax oil - per patient - a lot of Enzymes,amino acids and supplements Anti-oxidents Hormone support    Historical Provider, MD  vitamin C (ASCORBIC ACID) 500 MG tablet Take 500 mg by mouth daily.      Historical Provider, MD  VITAMIN D, CHOLECALCIFEROL, PO Take by mouth daily.      Historical Provider, MD  vitamin E 400 UNIT capsule Take 400 Units by mouth daily.      Historical Provider, MD   Social History   Social History  . Marital status: Single    Spouse name: N/A  . Number of children: N/A  . Years of education: N/A   Occupational History  . Not on file.   Social  History Main Topics  . Smoking status: Never Smoker  . Smokeless tobacco: Never Used  . Alcohol use 6.0 oz/week    10 Glasses of wine per week  . Drug use: No  . Sexual activity: Not on file   Other Topics Concern  . Not on file   Social History Narrative  . No narrative on file   Review of Systems  Constitutional: Positive for unexpected weight change (weight up and down - all over the place. ). Negative for diaphoresis (no night sweats. ) and fever.  Cardiovascular: Positive for palpitations. Negative for chest pain.  Gastrointestinal: Positive for anal bleeding and rectal pain. Negative for abdominal pain (gas only).      Objective:   Physical Exam  Constitutional: He is oriented to person, place, and time. He appears well-developed and well-nourished.  HENT:  Head: Normocephalic and atraumatic.  Eyes: EOM are normal. Pupils are equal, round, and reactive to light.  Neck: No JVD  present. Carotid bruit is not present. No thyromegaly (no nodules appreciated. ) present.  Cardiovascular: Normal rate, regular rhythm and normal heart sounds.   No murmur heard. Pulmonary/Chest: Effort normal and breath sounds normal. He has no rales.  Abdominal: There is no tenderness.  Musculoskeletal: He exhibits no edema.  Neurological: He is alert and oriented to person, place, and time.  Skin: Skin is warm and dry.  Psychiatric: He has a normal mood and affect.  Vitals reviewed.  Vitals:   04/06/16 0811  BP: 122/78  Pulse: 68  Resp: 16  Temp: 98.4 F (36.9 C)  TempSrc: Oral  SpO2: 100%  Weight: 114 lb 8 oz (51.9 kg)  Height: 5\' 5"  (1.651 m)      Assessment & Plan:   Shawn Carattini is a 61 y.o. male History of anal fissures Rectal bleeding Hemorrhoids, unspecified hemorrhoid type - Plan: betamethasone dipropionate (DIPROLENE) 0.05 % cream  - as above, I expressed concern of elevated AFP, rectal bleeding, and prior recommendation in 2007 for repeat colonoscopy in 3 years  that he could have polyps that have returned or progressed to colon cancer.He refused referral for colonoscopy at this time, and aware of risks and potential for colon cancer.  Did discuss potential for Cologuard but he has looked into cost and it was not a good option.  Also discussed flexible sigmoidoscopy if he did not want to have colonoscopy, and to check with various providers in the area to see if that would be an option. Could also look outside the area if needed.  -Recommended rectal exam to make sure that the areas he was having did appear to be anal fissures and not other significant/concerning external lesions. He refused external exam as well as refused possible Anoscopy.   - I did agree to refill his topical steroid as above at this time, and could potentially refill his diltiazem/lidocaine gel temporarily, but would not be able to do so long-term without at least doing some form of exam to make sure I am treating his condition appropriately, or evaluation with gastroenterology.   -fiber and sufficient fluids in diet to decrease risk of constipation  Hypothyroidism, unspecified type,  Thyroid nodule  - recent TSH ok, but with weight fluctuations in his report of variability in that TSH reading, did recommend changing to levothyroxine on which may provide more stable control than [place]-thyroid. Could also meet with endocrinologist if needed as he did have some questions regarding other hormones including estrogen with recent TSH.  If needed, could refill nature thyroid up to 6 months, but would recommend monitoring TSH more frequently through his lab in [place] if needed or I can order that testing here if he would like.  -Thyroid ultrasound report reviewed from 2015 with possible increase in size of cystic nodule. He did agree to have repeat thyroid ultrasound, paper prescription was provided at his request for that study.  Based on time required to discuss above issues, I requested another  visit to discuss palpitations, testosterone level on outside labs, and other lab work from [facility]. If any acute changes in his symptoms or worsening, recommend recheck here or other medical provider right away.  Meds ordered this encounter  Medications  . betamethasone dipropionate (DIPROLENE) 0.05 % cream    Sig: Apply topically 2 (two) times daily.    Dispense:  45 g    Refill:  0   Patient Instructions   Thyroid: usually monitored every 6 months, but as we  discussed there can be variability in levels with nature-thyroid versus levothyroxine. I would recommend changing to that medicine if variability in control or discuss options. If other hormone monitoring needed, that would need to be evaluated by endocrinologist. I would recommend repeat thyroid ultrasound - I will write the Rx today.   As we discussed, you are overdue for a colonoscopy and would recommend that especially with bleeding and elevated AFP levels on your blood work, as well as prior polyps and 3 year recommended follow up in 2007. Let me know if you change your mind or call [facility] to schedule this, or call gastroenterologist to see if flexible sigmoidoscopy would be possible.   Continue to work on diet and fiber, fluids in diet to lessen constipation.  Okay to continue the combination diltiazem/lidocaine gel if needed for anal fissure or external hemorrhoid irritation. I also wrote betamethasone for itching or irritation with external hemorrhoids. As we discussed, we do need to do an exam on that area for me to continue refilling that medication. That can be done next visit.   In regards to other blood tests, palpitations, testosterone discussion - please follow up to discuss further. Return to the clinic or go to the nearest emergency room if any of your symptoms worsen or new symptoms occur.   IF you received an x-ray today, you will receive an invoice from [facility]. Please contact [facility]  [facility] at [phone] with questions or concerns regarding your invoice.   IF you received labwork today, you will receive an invoice from [facility]. Please contact [facility] at [phone] with questions or concerns regarding your invoice.   Our billing staff will not be able to assist you with questions regarding bills from these companies.  You will be contacted with the lab results as soon as they are available. The fastest way to get your results is to activate your My Chart account. Instructions are located on the last page of this paperwork. If you have not heard from [place] regarding the results in 2 weeks, please contact this office.          I personally performed the services described in this documentation, which was scribed in my presence initially.  The recorded information was  reviewed and considered for accuracy and completeness, addended by me as needed, and agree with information above.  Signed,   Merri Ray, MD Primary Care at [facility].  04/06/16 9:51 AM

## 2016-04-06 NOTE — Patient Instructions (Addendum)
Thyroid: usually monitored every 6 months, but as we discussed there can be variability in levels with nature-thyroid versus levothyroxine. I would recommend changing to that medicine if variability in control or discuss options. If other hormone monitoring needed, that would need to be evaluated by endocrinologist. I would recommend repeat thyroid ultrasound - I will write the Rx today.   As we discussed, you are overdue for a colonoscopy and would recommend that especially with bleeding and elevated AFP levels on your blood work, as well as prior polyps and 3 year recommended follow up in 2007. Let me know if you change your mind or call [facility] to schedule this, or call gastroenterologist to see if flexible sigmoidoscopy would be possible.   Continue to work on diet and fiber, fluids in diet to lessen constipation.  Okay to continue the combination diltiazem/lidocaine gel if needed for anal fissure or external hemorrhoid irritation. I also wrote betamethasone for itching or irritation with external hemorrhoids. As we discussed, we do need to do an exam on that area for me to continue refilling that medication. That can be done next visit.   In regards to other blood tests, palpitations, testosterone discussion - please follow up to discuss further. Return to the clinic or go to the nearest emergency room if any of your symptoms worsen or new symptoms occur.   IF you received an x-ray today, you will receive an invoice from [facility]. Please contact [facility] at [phone] with questions or concerns regarding your invoice.   IF you received labwork today, you will receive an invoice from [facility]. Please contact [facility] at [phone] with questions or concerns regarding your invoice.   Our billing staff will not be able to assist you with questions regarding bills from these companies.  You will be contacted with the lab results as soon as they are available. The  fastest way to get your results is to activate your My Chart account. Instructions are located on the last page of this paperwork. If you have not heard from [place] regarding the results in 2 weeks, please contact this office.

## 2016-04-07 ENCOUNTER — Telehealth: Payer: Self-pay | Admitting: Family Medicine

## 2016-04-07 DIAGNOSIS — K625 Hemorrhage of anus and rectum: Secondary | ICD-10-CM | POA: Insufficient documentation

## 2016-04-07 NOTE — Telephone Encounter (Signed)
Dr. Carlota Raspberry- I spoke with Fleeta Emmer and he would like to pick up a copy of his AVS as soon as you are completed with it. He stated you said you had to complete it. If you let me know I will call him to come pickup a copy. Thank you

## 2016-04-07 NOTE — Telephone Encounter (Signed)
Dr. Mercie Eon was seen by Dr Carlota Raspberry on 04/06/16 and would like to speak to you about his visit. His number is [phone]. Thank you

## 2016-04-08 ENCOUNTER — Ambulatory Visit: Payer: Self-pay | Admitting: Family Medicine

## 2016-04-08 NOTE — Telephone Encounter (Signed)
Stephen [name] that office visit note completed.

## 2016-04-12 NOTE — Telephone Encounter (Signed)
Dr. Berniece Pap. Falotico would like me to send a friendly reminder that he needs to speak to you concerning his OV on 04/06/16. Thank you

## 2016-04-12 NOTE — Telephone Encounter (Signed)
Spoke with patient and answered questions.

## 2016-04-12 NOTE — Telephone Encounter (Signed)
Pt insists on speaking to Dr Tamala Julian directly about concerns

## 2016-04-12 NOTE — Telephone Encounter (Signed)
Spoke with patient; addressed questions and concerns about care.  Does not want any aggressive work up or intervention for malignancies including surgeries, chemotherapy, or radiation.  Does not desire any surgeries in future.  Desires symptomatic treatment for chronic conditions only.  Does not desire repeat colonoscopies in future.  Desires DNR; formalizing Living Will with attorney.

## 2016-04-12 NOTE — Telephone Encounter (Signed)
Left message on voicemail.  Advised patient that I will call patient later today.

## 2016-04-12 NOTE — Telephone Encounter (Signed)
AVS printed and placed in pick up box at clerical desk.

## 2016-04-13 ENCOUNTER — Telehealth: Payer: Self-pay | Admitting: Family Medicine

## 2016-04-13 NOTE — Telephone Encounter (Signed)
SMITH - Pt wants you to call him in regards to a "follow-up conversation" on what you spoke about Monday afternoon.  This is in regards to his "expanded AVS" that he picked up today, stating that it has errors.    He prefers an afternoon phone call if on Wednesday or Thursday.  Other than that call at your earliest convenience. [phone]

## 2016-04-14 NOTE — Telephone Encounter (Signed)
Dr. Tamala Julian, I tried to seek information as to what patient wanted to discuss with you. 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  Please advise.

## 2016-04-19 NOTE — Telephone Encounter (Signed)
PATIENT CALLED BACK FOR A FRIENDLY REMINDER FOR DR. Tamala Julian TO CALL HIM MAYBE AT THE END OF HER DAY TODAY.  BEST PHONE [phone]. [place]

## 2016-04-26 NOTE — Telephone Encounter (Signed)
Called patient --- left message on voicemail to discuss concerns.

## 2016-10-07 ENCOUNTER — Telehealth: Payer: Self-pay | Admitting: Family Medicine

## 2016-10-07 NOTE — Telephone Encounter (Signed)
Stephen [name] - Pt wants to speak to you again about the conversation he had with you about Dr. Carlota Raspberry a few months ago.  Says he is having some trouble and that another doctor "blew him off" and he wants your help.  [phone]

## 2016-10-08 ENCOUNTER — Telehealth: Payer: Self-pay | Admitting: *Deleted

## 2016-10-08 NOTE — Telephone Encounter (Signed)
Dr. Tamala Julian pt has concerns regarding provider that he seen. [phone].

## 2016-10-08 NOTE — Telephone Encounter (Signed)
Dr. Tamala Julian I spoke with Mr.Stephen [name] this morning and as the previous message states he would like you to contact him asap.  Thanks [phone]

## 2016-10-12 NOTE — Progress Notes (Signed)
Subjective:    Patient ID: Stephen [name], male    DOB: May 29, 1955, 61 y.o.   MRN: [id number]  10/13/2016  Medication Consult   HPI This 61 y.o. male presents for evaluation of external hemorrhoids with anal fissure and IBS diarrhea.  Previously evaluated by Dr. Carlota Raspberry on 04/06/16 to establish care. He has a history of identity theft thus does not participate with  MyChart or Care Everywhere.  He does not have a copy of his most recent colonoscopy by Dr. Earlean Shawl with him at the office visit. Patient is a previous patient of Sanda Klein, NP who closed her practice in January 2018. Patient has been practicing holistic medicine with [facility] in [place].  He has all labs completed through [facility] and has a copy of his previous lab work at visit today for review yet requested at his previous visit with Dr. Carlota Raspberry NOT to have a copy of the labs in his chart.  Patient reports two specific topics for discussion today:    Prescriptions: has limited income and pain management mode/comfort care is his goal at this time. Refuses aggressive intervention or elaborate testing.  Having rectal pain for ten years.  Has not returned to see Dr. Carlota Raspberry since 03/2016.  Has been with [facility] for years; receives all blood work.  Way too much detail in his notes with Dr. Carlota Raspberry.  Rectal bleeding is intermittent; no recent rectal bleeding in several months.  Expiration dates have occurred with previous prescriptions from Sanda Klein, NP; though had 2-3 refills remaining.  Sanda Klein was holistic NP who closed practice in January.  [place] [phone]; rx number [id number] is rx for medication needing refilled. Needing refill of betamethasone; expiration; would like one year refill. Rx expires 04/06/17.  Has not even used Betamethasone rx provided by Dr. Carlota Raspberry in 03/2016.  Has tube that will be good until 08/2017.  Has not had as many flares, so has not needed new rx.    Diltiazem 4% with Lidocaine  10% compound.  Needs new rx.  Not controlled substance.  67ml total provided in to two 90ml tubes.  NP Owens Shark gave six refills over the course of a year.  [facility] on [address].    Had a big flare up May 2017.  [facility] closed in January 2018.  Profuse blood with big flare in May 2017.  Nature throid. Just had fill at [facility] but rx ran out.  In process to get blood work through [facility] in [place], [place]. [place].  Most recent labs on 03/18/16 through [facility]: T4 Free 1.09 TSH 2.030 PSA 0.6 Hgb 14.3 Hct 41.3 Testosterone elevated at 1029. AFP elevated at 12.6.    Last colonoscopy in 2007.  Last rectal examination performed not sure; between 2007-2010 with [facility].  No longer seeing Dr. Earlean Shawl.     Has copy of colonoscopy by [facility] at home but not in file at office today.  Non-compliant with repeat colonoscopy in 2010 as recommended by [facility].  Not sure if had precancerous polyp; likely precancerous polyp but refused repeat colonoscopy.  Diffuse blackening of colon identified by Dr. Earlean Shawl as well. Patient terminated relationship with [facility].       BP Readings from Last 3 Encounters:  10/13/16 112/62  04/06/16 122/78  01/14/16 118/68   Wt Readings from Last 3 Encounters:  10/13/16 117 lb (53.1 kg)  04/06/16 114 lb 8 oz (51.9 kg)  01/14/16 120 lb (54.4 kg)   Immunization History  Administered Date(s) Administered  . Td 01/12/2004    Review of Systems  Constitutional: Negative for activity change, appetite change, chills, diaphoresis, fatigue and fever.  Respiratory: Negative for cough and shortness of breath.   Cardiovascular: Negative for chest pain, palpitations and leg swelling.  Gastrointestinal: Positive for anal bleeding, constipation and diarrhea. Negative for abdominal distention, abdominal pain, blood in stool, nausea, rectal pain and vomiting.  Endocrine: Negative for cold intolerance, heat intolerance, polydipsia, polyphagia and polyuria.  Skin:  Negative for color change, rash and wound.  Neurological: Negative for dizziness, tremors, seizures, syncope, facial asymmetry, speech difficulty, weakness, light-headedness, numbness and headaches.  Psychiatric/Behavioral: Negative for dysphoric mood and sleep disturbance. The patient is nervous/anxious.     Past Medical History:  Diagnosis Date  . Anal fissure   . Heart murmur   . Hypogonadism male   . Hypothyroidism   . IBS (irritable bowel syndrome)   . Internal hemorrhoid   . Pancytopenia ([place])   . Thyroid nodule   . Tubular adenoma of colon 01/11/2005   colonoscopy; [facility].   Past Surgical History:  Procedure Laterality Date  . COLONOSCOPY  01/11/2005   Rectal and right colon polyps resected, melanosis coli severe universal with sparing; internal hemorrhoids grade II.  Repeat 3 years.  [facility].  . TONSILLECTOMY     No Known Allergies Current Outpatient Prescriptions  Medication Sig Dispense Refill  . betamethasone dipropionate (DIPROLENE) 0.05 % cream Apply topically 2 (two) times daily. 45 g 0  . Multiple Vitamin (MULTIVITAMIN) tablet Take 8 tablets by mouth daily. Takes 8 tablets by mouth daily to equal a full dose.    . NON FORMULARY OTC Fish oil and Flax oil - per patient - a lot of Enzymes,amino acids and supplements Anti-oxidents Hormone support    . vitamin C (ASCORBIC ACID) 500 MG tablet Take 500 mg by mouth daily.      Marland Kitchen VITAMIN D, CHOLECALCIFEROL, PO Take by mouth daily.      . vitamin E 400 UNIT capsule Take 400 Units by mouth daily.      Marland Kitchen NATURE-THROID 65 MG tablet TK 1 T PO QD UTD  0   No current facility-administered medications for this visit.    Social History   Social History  . Marital status: Single    Spouse name: N/A  . Number of children: N/A  . Years of education: N/A   Occupational History  . Not on file.   Social History Main Topics  . Smoking status: Never Smoker  . Smokeless tobacco: Never Used  . Alcohol use 6.0 oz/week    10  Glasses of wine per week  . Drug use: No  . Sexual activity: Not on file   Other Topics Concern  . Not on file   Social History Narrative  . No narrative on file   Family History  Problem Relation Age of Onset  . Cancer Father        prostate  . Stroke Father   . Heart disease Father   . Hyperlipidemia Father   . Hypertension Father   . Cancer Maternal Grandmother        myeloma       Objective:    BP 112/62   Pulse 74   Temp 98 F (36.7 C) (Oral)   Resp 16   Ht 5' 5.35" (1.66 m)   Wt 117 lb (53.1 kg)   SpO2 97%   BMI 19.26 kg/m  Physical Exam  Constitutional:  He is oriented to person, place, and time. He appears well-developed and well-nourished. No distress.  HENT:  Head: Normocephalic and atraumatic.  Eyes: Pupils are equal, round, and reactive to light. Conjunctivae and EOM are normal.  Neck: Normal range of motion. Carotid bruit is not present.  Pulmonary/Chest: Effort normal. No respiratory distress.  Genitourinary:  Genitourinary Comments: RECTAL EXAM REFUSED BY PATIENT.  Neurological: He is alert and oriented to person, place, and time. No cranial nerve deficit.  Skin: Skin is warm and dry. He is not diaphoretic.  Psychiatric: He has a normal mood and affect. His behavior is normal.  Nursing note and vitals reviewed.   No results found. Depression screen [facility] 2/9 10/13/2016 04/06/2016 01/14/2016 08/11/2015  Decreased Interest 0 0 0 0  Down, Depressed, Hopeless 0 0 0 0  PHQ - 2 Score 0 0 0 0   Fall Risk  10/13/2016 04/06/2016 08/11/2015  Falls in the past year? No No No        Assessment & Plan:   1. Rectal bleeding   2. Internal hemorrhoid   3. Tubular adenoma polyp of rectum   4. Irritable bowel syndrome with both constipation and diarrhea   5. Generalized anxiety disorder   6. Hypothyroidism due to acquired atrophy of thyroid   7. Thyroid nodule   8. History of anal fissures   9. Family history of prostate cancer in father    -patient  reporting ten year history of intermittent rectal bleeding assumed due to internal hemorrhoids and recurrent/persistent anal fissure.  PATIENT REFUSES RECTAL EXAM; PATIENT ALSO REFUSES REPEAT COLONOSCOPY.  Successful symptomatic treatment with betamethasone 0.05% PRN and ditiazem 4% with Lidocaine 10% compounded topical treatment.   -patient requesting refill of betamethasone cream though refill from last visit not filled as of yet and will not expire for another six months; refill x 1 provided.    -patient requesting refill of Diltiazem 4% with Lidocaine 10% compounded rx; refill x 2 provided. Patient to request pharmacy to contact office when patient fills last refill.   -patient refused repeat colonoscopy in 2010 as recommended by Dr. Earlean Shawl; patient does not have copy of colonoscopy from 2007 with him today; will need to obtain or review copy of colonoscopy report.  -patient reports history of IBS for years which is likely etiology to internal hemorrhoids and anal fissures.  Will need to obtain GI records with diagnosis of IBS at future appointments. -hypothyroidism chronic with history of thyroid nodules.  Most recent thyroid labs in March 2018 with normal levels; agreeable to refill Nature-Throid 65mg  for patient.   -family hx of prostate cancer in father; recent PSA in 03/2016 WNL.  -prolonged face-to-face for 25 minutes with greater than 50% of time dedicated to counseling and coordination of care. -patient demonstrates significant anxiety regarding having medications on hand and refilled far in advance.    No orders of the defined types were placed in this encounter.  Meds ordered this encounter  Medications  . NATURE-THROID 65 MG tablet    Sig: TK 1 T PO QD UTD    Refill:  0  . betamethasone dipropionate (DIPROLENE) 0.05 % cream    Sig: Apply topically 2 (two) times daily.    Dispense:  45 g    Refill:  0    No Follow-up on file.   Kristi Elayne Guerin, M.D. Primary Care at [facility] [address] [place], [place]  [address] [phone]  phone [phone] fax

## 2016-10-13 ENCOUNTER — Ambulatory Visit (INDEPENDENT_AMBULATORY_CARE_PROVIDER_SITE_OTHER): Payer: Self-pay | Admitting: Family Medicine

## 2016-10-13 ENCOUNTER — Encounter: Payer: Self-pay | Admitting: Family Medicine

## 2016-10-13 VITALS — BP 112/62 | HR 74 | Temp 98.0°F | Resp 16 | Ht 65.35 in | Wt 117.0 lb

## 2016-10-13 DIAGNOSIS — E034 Atrophy of thyroid (acquired): Secondary | ICD-10-CM

## 2016-10-13 DIAGNOSIS — D128 Benign neoplasm of rectum: Secondary | ICD-10-CM

## 2016-10-13 DIAGNOSIS — E041 Nontoxic single thyroid nodule: Secondary | ICD-10-CM

## 2016-10-13 DIAGNOSIS — K625 Hemorrhage of anus and rectum: Secondary | ICD-10-CM

## 2016-10-13 DIAGNOSIS — K648 Other hemorrhoids: Secondary | ICD-10-CM

## 2016-10-13 DIAGNOSIS — F411 Generalized anxiety disorder: Secondary | ICD-10-CM

## 2016-10-13 DIAGNOSIS — K582 Mixed irritable bowel syndrome: Secondary | ICD-10-CM

## 2016-10-13 DIAGNOSIS — Z8719 Personal history of other diseases of the digestive system: Secondary | ICD-10-CM

## 2016-10-13 DIAGNOSIS — Z8042 Family history of malignant neoplasm of prostate: Secondary | ICD-10-CM

## 2016-10-13 MED ORDER — BETAMETHASONE DIPROPIONATE 0.05 % EX CREA: TOPICAL_CREAM | Freq: Two times a day (BID) | CUTANEOUS | 0 refills | Status: AC

## 2016-10-13 NOTE — Telephone Encounter (Signed)
Was seen today in the office and Rx was called into pharmacy.

## 2016-10-13 NOTE — Patient Instructions (Signed)
     IF you received an x-ray today, you will receive an invoice from [facility]. Please contact [facility] at [phone] with questions or concerns regarding your invoice.   IF you received labwork today, you will receive an invoice from [facility]. Please contact [facility] at [phone] with questions or concerns regarding your invoice.   Our billing staff will not be able to assist you with questions regarding bills from these companies.  You will be contacted with the lab results as soon as they are available. The fastest way to get your results is to activate your My Chart account. Instructions are located on the last page of this paperwork. If you have not heard from us regarding the results in 2 weeks, please contact this office.     

## 2016-10-14 ENCOUNTER — Other Ambulatory Visit: Payer: Self-pay | Admitting: Family Medicine

## 2016-10-14 ENCOUNTER — Encounter: Payer: Self-pay | Admitting: Family Medicine

## 2016-10-14 DIAGNOSIS — D126 Benign neoplasm of colon, unspecified: Secondary | ICD-10-CM

## 2016-10-14 NOTE — Telephone Encounter (Signed)
Reviewed message; discussed with patient the visit encounter.  I recalled the message.  Pt reminds provider that we spoke April 12, 2016, patient has been practicing holistic medicine.  Has been using medication for ten years.  Refuses surgery; refuses colonoscopies.  Last year, had a horrible flare up and warranted 4% and 10%. Mission right now is to manage pain.  Does not desire intervention.  For peace of patient's mind, needs refill of medications.  Non-controlled substance; both [facility] and NP Sanda Klein added that the topical medications can harm patient.  Would need six refills; does not want to be inconvenienced or have to pay more money.  In the same spirit that discussed with Dr. Carlota Raspberry, there will be no benefit to examination.  Will refuse surgery or invasive procedures.   It is the patient's choice.  It is all about the patient's convenience per NP Owens Shark.  She also did not have copy of Dr. Liliane Channel colonoscopy. Just filled prescription from NP [facility] yesterday; will expire in 45 days from now.  Very upset with current conversation.  Colonoscopy: fecal and right colon polyps resected, melanosis coli severe universal with sparing; internal hemorrhoids grade II.  Having intermittent or spotty bleeding; will have intermittent excessive bleeding with straining and constipation from IBS.  Pathology: tubular adenoma, melanosis coli; no high grade dysplasia or malignancy identified.  Edematous epithelium consistent with tubular adenoma.  Also discussed with patient not calling office repetitively for a request/concern.  Has also seen general surgeon in the past.  Frequency has not changed much.

## 2016-10-14 NOTE — Telephone Encounter (Signed)
OK SPOKE WITH PATIENT FOR 30 MIN PT WAS UPSET ABOUT HIS MEDICINE THAT DR Tamala Julian WAS SUPPOSE TO FILL FOR HIM HE STATES THAT HES UPSET BECAUSE HE STATES THAT DR Tamala Julian LOOKED IN HIS EYES AND SAID THREE TIMES THAT SHE WAS GOING TO REFILL HIS MEDICINE WHICH SHE DOCUMENTED IN HER NOTES HERE ARE THE MEDICINES PLEASE PLEASE MAKE SURE THEY ARE FILLED CORRECTLY AND PLEASE CALL PT WHEN THEY ARE CALLED INTO PHARMACY   NATURE THYROID 65 MG TABS EQUAL TO ONE GRAN QUANITY 365 PILL FOR 1 YEAR HE WILL NOT FILL IT NOW BUT WILL REFILL IT WHEN HIS MED RUN OUT  PLEASE SEND TO THE [facility] ON [address] HE WILL SAVE $20 ON MEDICINE THERE SCRIPTION #[id number]  DOCUMENTED IN DR NOTES  [facility] [phone] [facility]ARM[facility]IST Rebecca RD NURSE PRACTIONER LESLIE BROWN PERSCRIBE THIS FOR PT BEFORE  A COMPOUND RX  4% DILTIAZEM HCL 10% LIDOCAINE GEL NOT CREAM SCRIPT #639432 QUANTITY #6 30 MILLILITER VIAL FOR THE NEXT 12 MONTHS OR LESS IF HE HAS A FLARE UP   PLEASE CALL PATIENT

## 2016-10-19 ENCOUNTER — Encounter: Payer: Self-pay | Admitting: Family Medicine

## 2016-10-19 DIAGNOSIS — F411 Generalized anxiety disorder: Secondary | ICD-10-CM | POA: Insufficient documentation

## 2016-10-19 DIAGNOSIS — E041 Nontoxic single thyroid nodule: Secondary | ICD-10-CM | POA: Insufficient documentation

## 2016-10-19 DIAGNOSIS — E034 Atrophy of thyroid (acquired): Secondary | ICD-10-CM | POA: Insufficient documentation

## 2016-10-19 DIAGNOSIS — Z8719 Personal history of other diseases of the digestive system: Secondary | ICD-10-CM | POA: Insufficient documentation

## 2016-10-19 DIAGNOSIS — Z8042 Family history of malignant neoplasm of prostate: Secondary | ICD-10-CM | POA: Insufficient documentation

## 2016-10-19 DIAGNOSIS — K648 Other hemorrhoids: Secondary | ICD-10-CM | POA: Insufficient documentation

## 2016-10-20 ENCOUNTER — Telehealth: Payer: Self-pay | Admitting: Family Medicine

## 2016-10-29 ENCOUNTER — Telehealth: Payer: Self-pay | Admitting: Family Medicine

## 2016-10-29 NOTE — Telephone Encounter (Signed)
Patient calling to speak with Charlena Cross regarding refill that was sent in for diltiazem cream.  Upset that only received one vial instead of vials as with prior rx.  Would like to receive the same amount of cream as in the past from a previous provider.  Last filled 10/12/16 filled by Shawn Route, FNP; written the next day.  Has not been filled.  Rx written the same exact as previous rx; confirmed by Pharmacy Gilmore Laroche at [facility].

## 2016-10-29 NOTE — Telephone Encounter (Signed)
Pt is needing a phone call back from Mickel Baas regarding his medication refill   Best number [phone]

## 2017-01-19 ENCOUNTER — Other Ambulatory Visit: Payer: Self-pay

## 2017-01-19 ENCOUNTER — Ambulatory Visit: Payer: Self-pay | Admitting: Physician Assistant

## 2017-01-19 ENCOUNTER — Ambulatory Visit: Payer: Self-pay

## 2017-01-19 ENCOUNTER — Ambulatory Visit (INDEPENDENT_AMBULATORY_CARE_PROVIDER_SITE_OTHER): Payer: Self-pay

## 2017-01-19 ENCOUNTER — Encounter: Payer: Self-pay | Admitting: Physician Assistant

## 2017-01-19 VITALS — BP 123/80 | HR 72 | Resp 16 | Ht 65.35 in | Wt 117.0 lb

## 2017-01-19 DIAGNOSIS — T148XXA Other injury of unspecified body region, initial encounter: Secondary | ICD-10-CM

## 2017-01-19 DIAGNOSIS — S61216A Laceration without foreign body of right little finger without damage to nail, initial encounter: Secondary | ICD-10-CM

## 2017-01-19 NOTE — Telephone Encounter (Signed)
Patient called in reporting an injury to his pinky finger on his right hand. He stated "a ladder smashed my finger, there's a laceration. When it happened, it bled profusely, but I was able to get it stopped. I cleaned it with peroxide, salt water and dressed it. Last night it was throbbing, so I loosened the bandage." I asked was it bleeding now, he said "no, but the bandaid I took off is pink colored." He describes the wound as "1/4" oval circle diameter, flap of skin is white, translucent colored, skin on the pinky is pink, nailbed is pink and the nail is not affected, it is the bottom of the finger, there is a slight discoloration, where the flap skin is attached is a fine line of discoloration like bruising." I asked was he able to move the finger, he said "yes, no problems there." He says the pain is more of a "burning, mild to moderate (1-4), constant." He says he cleaned it today with chlorine water and applied the bandaid, he was just wondering if he needed to come in to be seen.  According to the protocol, see PCP within 3 days, appointment made for today, care advice given, verbalized understanding.   Reason for Disposition . [1] Last tetanus shot > 5 years ago AND [2] DIRTY cut or scrape  Answer Assessment - Initial Assessment Questions 1. MECHANISM: "How did the injury happen?"      Ladder crushed finger, right hand pinky 2. ONSET: "When did the injury happen?" (Minutes or hours ago)      Yesterday 3. LOCATION: "What part of the finger is injured?" "Is the nail damaged?"      Underneath finger 4. APPEARANCE of the INJURY: "What does the injury look like?"      Flap of skin covering up 1/4 inch diameter slice and the flap outside edge of pinky, appears puncture in the middle 1/16" diameter, underneath skin flap is b 5. SEVERITY: "Can you use the hand normally?"  "Can you bend your fingers into a ball and then fully open them?"     No problem 6. SIZE: For cuts, bruises, or swelling, ask:  "How large is it?" (e.g., inches or centimeters;  entire finger)      Oval shape 1/4" diameter, finger tip 7. PAIN: "Is there pain?" If so, ask: "How bad is the pain?"    (e.g., Scale 1-10; or mild, moderate, severe)     Mild to moderate 8. TETANUS: For any breaks in the skin, ask: "When was the last tetanus booster?"     Unknown 9. OTHER SYMPTOMS: "Do you have any other symptoms?"     No 10. PREGNANCY: "Is there any chance you are pregnant?" "When was your last menstrual period?"     N/A  Protocols used: FINGER INJURY-A-AH

## 2017-01-19 NOTE — Progress Notes (Deleted)
    01/19/2017 5:27 PM   DOB: 1955-05-30 / MRN: [id number]  SUBJECTIVE:  Stephen [name] is a 62 y.o. male presenting for   He has No Known Allergies.   He  has a past medical history of Anal fissure, Heart murmur, Hypogonadism male, Hypothyroidism, IBS (irritable bowel syndrome), Internal hemorrhoid, Pancytopenia ([place]), Thyroid nodule, and Tubular adenoma of colon (01/11/2005).    He  reports that  has never smoked. he has never used smokeless tobacco. He reports that he drinks about 6.0 oz of alcohol per week. He reports that he does not use drugs. He  has no sexual activity history on file. The patient  has a past surgical history that includes Tonsillectomy and Colonoscopy (01/11/2005).  His family history includes Cancer in his father and maternal grandmother; Heart disease in his father; Hyperlipidemia in his father; Hypertension in his father; Stroke in his father.  ROS  The problem list and medications were reviewed and updated by myself where necessary and exist elsewhere in the encounter.   OBJECTIVE:  BP 123/80 (BP Location: Left Arm, Patient Position: Sitting, Cuff Size: Normal)   Pulse 72   Resp 16   Ht 5' 5.35" (1.66 m)   Wt 117 lb (53.1 kg)   SpO2 100%   BMI 19.26 kg/m   Physical Exam  No results found for this or any previous visit (from the past 72 hour(s)).  No results found.  ASSESSMENT AND PLAN:  Stephen [name] was seen today for laceration.  Diagnoses and all orders for this visit:  Crush injury -     DG Finger Little Right; Future    The patient is advised to call or return to clinic if he does not see an improvement in symptoms, or to seek the care of the closest emergency department if he worsens with the above plan.   Philis Fendt, MHS, PA-C Primary Care at [facility] 01/19/2017 5:27 PM

## 2017-01-19 NOTE — Patient Instructions (Signed)
     IF you received an x-ray today, you will receive an invoice from [facility]. Please contact [facility] at [phone] with questions or concerns regarding your invoice.   IF you received labwork today, you will receive an invoice from [facility]. Please contact [facility] at [phone] with questions or concerns regarding your invoice.   Our billing staff will not be able to assist you with questions regarding bills from these companies.  You will be contacted with the lab results as soon as they are available. The fastest way to get your results is to activate your My Chart account. Instructions are located on the last page of this paperwork. If you have not heard from us regarding the results in 2 weeks, please contact this office.     

## 2017-01-20 ENCOUNTER — Ambulatory Visit: Payer: Self-pay | Admitting: Family Medicine

## 2017-01-20 NOTE — Progress Notes (Signed)
    01/20/2017 11:09 AM   DOB: 20-Nov-1955 / MRN: [id number]  SUBJECTIVE:  Stephen [name] is a 62 y.o. male presenting for laceration to the right little finger that occurred during a crush injury with a ladder. No insured.  Declines TD. Finger exquisitely painful.     Review of Systems  Neurological: Negative for sensory change and focal weakness.    OBJECTIVE:  BP 123/80 (BP Location: Left Arm, Patient Position: Sitting, Cuff Size: Normal)   Pulse 72   Resp 16   Ht 5' 5.35" (1.66 m)   Wt 117 lb (53.1 kg)   SpO2 100%   BMI 19.26 kg/m   Physical Exam  Musculoskeletal:       Hands: Vitals reviewed.    Dg Finger Little Right  Result Date: 01/19/2017 CLINICAL DATA:  62 year old male with history of crush injury 24 hours ago to the right fifth finger with overlying laceration. EXAM: RIGHT LITTLE FINGER 2+V COMPARISON:  None. FINDINGS: There is no evidence of fracture or dislocation. There is no evidence of arthropathy or other focal bone abnormality. Soft tissues are unremarkable. IMPRESSION: Negative. Electronically Signed   By: Vinnie Langton M.D.   On: 01/19/2017 17:31    ASSESSMENT AND PLAN:  Stephen [name] was seen today for laceration.  Diagnoses and all orders for this visit:  Crush injury -     DG Finger Little Right; Future  Laceration of right little finger without foreign body without damage to nail, initial encounter: Will leave this to heal by secondary intention.  Mupirocin provided.     The patient is advised to call or return to clinic if he does not see an improvement in symptoms, or to seek the care of the closest emergency department if he worsens with the above plan.   Philis Fendt, MHS, PA-C Primary Care at [facility] 01/20/2017 11:09 AM

## 2017-02-16 ENCOUNTER — Telehealth: Payer: Self-pay | Admitting: Physician Assistant

## 2017-02-16 NOTE — Telephone Encounter (Signed)
His visit was nearly a month ago.  I cannot reliably do anything over the phone to help him at this point.  I need to see him back in the office.

## 2017-02-16 NOTE — Telephone Encounter (Signed)
Phone call to patient, relayed message from provider. He states, "I didn't expect this reply back. I don't have insurance and I'm on a limited income. I just have a couple of quick questions for him to see if it's justified for me to come in and spend money on another visit."  Please advise.

## 2017-02-16 NOTE — Telephone Encounter (Signed)
Incoming call transferred from patient call center. Patient is calling to follow up on finger crush visit 01/19/17 with Philis Fendt. He states that provider instructed him to call back if he is having any further issues, he declines to give any further details due to "loss of communication". He can be reached at [phone], has someone coming to service furnace at 12-3pm- would like a call from provider today.

## 2017-02-16 NOTE — Telephone Encounter (Signed)
Phone call to patient. Relayed message from provider. He states, "I just had a few quick questions about the validity of the xray exam" and "My finger hurts when I push it." Recommended office visit to patient, he declines. Offered patient information on e-visits, instacare or low-cost healthcare since he is without insurance. He declines, states he is disappointed. Patient ended call.

## 2017-02-16 NOTE — Telephone Encounter (Signed)
Again it has been too long.  I would need to take his complaints in the context with physical exam.  I would need to see him back to feel comfortable helping him. Philis Fendt, MS, PA-C 10:31 AM, 02/16/2017

## 2017-06-07 ENCOUNTER — Encounter: Payer: Self-pay | Admitting: Family Medicine

## 2018-01-30 ENCOUNTER — Telehealth: Payer: Self-pay

## 2018-01-30 ENCOUNTER — Telehealth: Payer: Self-pay | Admitting: Family Medicine

## 2018-01-30 NOTE — Telephone Encounter (Signed)
Call from pt requesting refill of Diprolene for anal pain.  Reviewed chart.  Pt has not been seen in just over one year.  Advised pt he will need an appt.  Pt very upset that he needs OV, states Dr. Tamala Julian told him he would have medicine kept on file for whenever he needs a refill.  Pt upset that OV required for medicine that is not a controlled substance.  Advised that the providers need to periodically assess patients to ensure that the Rx is the most appropriate for them.  Pt increasingly agitated stating that we need to show compassion instead of hiding behind the politics.  Clarified that periodic appts are needed to ensure patient safety regardless of what type of medicine.  Advised that I will confirm with lead physician and c/b. Unable to meet with lead physician today but spoke to Dr. Carlota Raspberry who confirmed pt will need appt for a refill.  Attempted to call pt twice but unable to get through.  If pt calls back, please schedule appt.

## 2018-01-30 NOTE — Telephone Encounter (Signed)
Pt calling to request "Compounded medications" be called in to pharmacy. Pt with agitated affect, yelling, demanding. Difficult to redirect. States he made request with Andria Frames at 1125 and spoke to her until 1140. States he is in "Excruciating pain." Assured pt TN would send message to practice high priority AND call practice to alert them of situation. Pt states "I expect a call back within the hour." States meds are compounded and the pharmacy closes at 7pm. Pt continued to yell out and hung up.  Requesting "Rectal gel HCL, lidocaine 4/10% mixture. Unsure of medications requested as difficult to understand pt as he was hyperverbal.  Pt agitated, loud and insulting.  TN called practice and spoke with Hassan Rowan who attempted to reach [facility]. Hassan Rowan aware TE will be sent high priority.

## 2020-02-28 ENCOUNTER — Telehealth: Payer: Self-pay | Admitting: Gastroenterology

## 2020-02-28 NOTE — Telephone Encounter (Signed)
Hi Sheri, could you pls call this pt.? He used to see Dr. Fuller Plan back in 2006. He called asking to speak with a clinical staff in reference to his gi issues that he has had for several years. I tried to obtain more information from this pt and the only thing that he said is that he needed a prescription for rectal fissure. I told pt that he needed an ov since he had not been seen since 2006. However, he became irate and told me that he needed to speak with a nurse first because he needs to know if his clinical needs and expectations are going to be met before he schedules an appt. He is requesting a call in the afternoon and since you will most likely get his voice mail (per pt words), please leave a message with a list of good times in the afternoon that he can call you back. Thank you.

## 2020-02-29 NOTE — Telephone Encounter (Signed)
Left message for patient to call back  

## 2020-02-29 NOTE — Telephone Encounter (Signed)
I returned call to the patient.  He has not been seen here since 2006.  He is wanting a "refill of diltiazem".  He is advised that I am happy to make him an appointment with Dr. Fuller Plan or one of the [facility], but will not be able to send in an RX without being seen in the office and examining him.  He says he has a rectal fissure and wants a "beningn rx sent in". We discussed that he will need to be seen at least annually for refills. He states he will call back for an office visit when he has his calendar.

## 2020-10-04 ENCOUNTER — Other Ambulatory Visit: Payer: Self-pay

## 2020-10-04 ENCOUNTER — Emergency Department (HOSPITAL_COMMUNITY): Payer: Medicare HMO

## 2020-10-04 ENCOUNTER — Encounter (HOSPITAL_COMMUNITY): Payer: Self-pay

## 2020-10-04 ENCOUNTER — Emergency Department (HOSPITAL_COMMUNITY)
Admission: EM | Admit: 2020-10-04 | Discharge: 2020-10-04 | Disposition: A | Discharge status: Home / Self Care | Payer: Medicare HMO | Attending: Emergency Medicine | Admitting: Emergency Medicine

## 2020-10-04 DIAGNOSIS — E039 Hypothyroidism, unspecified: Secondary | ICD-10-CM | POA: Diagnosis not present

## 2020-10-04 DIAGNOSIS — N201 Calculus of ureter: Secondary | ICD-10-CM | POA: Diagnosis not present

## 2020-10-04 DIAGNOSIS — R109 Unspecified abdominal pain: Secondary | ICD-10-CM | POA: Diagnosis present

## 2020-10-04 LAB — COMPREHENSIVE METABOLIC PANEL
ALT: 33 U/L (ref 0–44)
AST: 31 U/L (ref 15–41)
Albumin: 4.2 g/dL (ref 3.5–5.0)
Alkaline Phosphatase: 55 U/L (ref 38–126)
Anion gap: 9 (ref 5–15)
BUN: 17 mg/dL (ref 8–23)
CO2: 25 mmol/L (ref 22–32)
Calcium: 10.1 mg/dL (ref 8.9–10.3)
Chloride: 108 mmol/L (ref 98–111)
Creatinine, Ser: 0.88 mg/dL (ref 0.61–1.24)
GFR, Estimated: >60 mL/min (ref >60)
Glucose, Bld: 153 mg/dL — ABNORMAL HIGH (ref 70–99)
Potassium: 4 mmol/L (ref 3.5–5.1)
Sodium: 142 mmol/L (ref 135–145)
Total Bilirubin: 0.7 mg/dL (ref 0.3–1.2)
Total Protein: 6.6 g/dL (ref 6.5–8.1)

## 2020-10-04 LAB — CBC WITH DIFFERENTIAL/PLATELET
Abs Immature Granulocytes: 0.04 10*3/uL (ref 0.00–0.07)
Basophils Absolute: 0 10*3/uL (ref 0.0–0.1)
Basophils Relative: 0 %
Eosinophils Absolute: 0 10*3/uL (ref 0.0–0.5)
Eosinophils Relative: 0 %
HCT: 41.7 % (ref 39.0–52.0)
Hemoglobin: 13.8 g/dL (ref 13.0–17.0)
Immature Granulocytes: 0 %
Lymphocytes Relative: 4 %
Lymphs Abs: 0.4 10*3/uL — ABNORMAL LOW (ref 0.7–4.0)
MCH: 34.3 pg — ABNORMAL HIGH (ref 26.0–34.0)
MCHC: 33.1 g/dL (ref 30.0–36.0)
MCV: 103.7 fL — ABNORMAL HIGH (ref 80.0–100.0)
Monocytes Absolute: 0.9 10*3/uL (ref 0.1–1.0)
Monocytes Relative: 9 %
Neutro Abs: 9.5 10*3/uL — ABNORMAL HIGH (ref 1.7–7.7)
Neutrophils Relative %: 87 %
Platelets: 186 10*3/uL (ref 150–400)
RBC: 4.02 MIL/uL — ABNORMAL LOW (ref 4.22–5.81)
RDW: 12.6 % (ref 11.5–15.5)
WBC: 10.9 10*3/uL — ABNORMAL HIGH (ref 4.0–10.5)
nRBC: 0 % (ref 0.0–0.2)

## 2020-10-04 LAB — URINALYSIS, ROUTINE W REFLEX MICROSCOPIC
Bacteria, UA: NONE SEEN
Bilirubin Urine: NEGATIVE
Glucose, UA: NEGATIVE mg/dL
Hgb urine dipstick: NEGATIVE
Ketones, ur: NEGATIVE mg/dL
Leukocytes,Ua: NEGATIVE
Nitrite: NEGATIVE
Protein, ur: NEGATIVE mg/dL
Specific Gravity, Urine: 1.03 (ref 1.005–1.030)
pH: 6 (ref 5.0–8.0)

## 2020-10-04 MED ORDER — KETOROLAC TROMETHAMINE 10 MG PO TABS: 10.0000 mg | ORAL_TABLET | Freq: Three times a day (TID) | ORAL | 0 refills | Status: AC | PRN

## 2020-10-04 NOTE — Discharge Instructions (Addendum)
Take toradol as needed for pain. Do not take more than 3 times a day. Take with food.  Make sure you are staying well-hydrated water. Call the urology office listed below to set up a follow-up appointment. Use the urine strainer to help catch a stone. Return to the emergency room if you develop fevers, severe worsening pain, persistent vomiting, or any new, worsening, or concerning symptoms.

## 2020-10-04 NOTE — ED Triage Notes (Signed)
Pt BIB EMS. Pt c/o right flank pain that radiates to his abd. Pt states he urinated around 5am and has had urinary retention since then. No blood noted. Pt has hx of kidney stones.

## 2020-10-04 NOTE — ED Provider Notes (Signed)
[facility] DEPT Provider Note   CSN: [id number] Arrival date & time: 10/04/20  7371     History Chief Complaint  Patient presents with   Flank Pain   Abdominal Pain    Stephen [name] is a 65 y.o. male presenting for evaluation of flank and abdominal pain.  Patient states last night between midnight and 2 AM he developed pain in his right flank.  Since then, he has had continued pain, which is now radiating around to his right lower abdomen.  He also reports the urge to urinate, but is unable to do so.  Over the course of the night, he had very little urine output despite trying to urinate multiple times.  He denies blood in his urine.  No dysuria.  He has a history of kidney stones about 20 years ago, states this feels similar.  He reports associated nausea and dry heaves.  No fever.  No pain on the left side.  He takes "lots of supplements" but does not take any medication and states "I do not see doctors."  He denies cough, chest pain, shortness of breath, abnormal bowel movements.  HPI     Past Medical History:  Diagnosis Date   Anal fissure    Heart murmur    Hypogonadism male    Hypothyroidism    IBS (irritable bowel syndrome)    Internal hemorrhoid    Pancytopenia (HCC)    Thyroid nodule    Tubular adenoma of colon 01/11/2005   colonoscopy; [facility].    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   Thyroid nodule 10/19/2016   Internal hemorrhoid 10/19/2016   History of anal fissures 10/19/2016   Family history of prostate cancer in father 10/19/2016   Tubular adenoma of colon 10/14/2016   Rectal bleeding 04/07/2016   Palpitations 05/30/2013   Family history of coronary artery disease 05/30/2013   Other malaise and fatigue 05/14/2013   IBS 11/04/2006   Tubular adenoma polyp of rectum 11/04/2006    Past Surgical  History:  Procedure Laterality Date   COLONOSCOPY  01/11/2005   Rectal and right colon polyps resected, melanosis coli severe universal with sparing; internal hemorrhoids grade II.  Repeat 3 years.  [facility].   TONSILLECTOMY         Family History  Problem Relation Age of Onset   Cancer Father        prostate   Stroke Father    Heart disease Father    Hyperlipidemia Father    Hypertension Father    Cancer Maternal Grandmother        myeloma    Social History   Tobacco Use   Smoking status: Never   Smokeless tobacco: Never  Vaping Use   Vaping Use: Never used  Substance Use Topics   Alcohol use: Yes    Alcohol/week: 10.0 standard drinks    Types: 10 Glasses of wine per week   Drug use: No    Home Medications Prior to Admission medications   Medication Sig Start Date End Date Taking? Authorizing Provider  ketorolac (TORADOL) 10 MG tablet Take 1 tablet (10 mg total) by mouth every 8 (eight) hours as needed. 10/04/20  Yes Arlene Genova, PA-C  betamethasone dipropionate (DIPROLENE) 0.05 % cream Apply topically 2 (two) times daily. 10/13/16   Wardell Honour, MD  Multiple Vitamin (MULTIVITAMIN) tablet Take 8 tablets  by mouth daily. Takes 8 tablets by mouth daily to equal a full dose.    [provider]  NATURE-THROID 65 MG tablet TK 1 T PO QD UTD 09/04/16   [provider]  NON FORMULARY OTC Fish oil and Flax oil - per patient - a lot of Enzymes,amino acids and supplements Anti-oxidents Hormone support    [provider]  vitamin C (ASCORBIC ACID) 500 MG tablet Take 500 mg by mouth daily.      [provider]  VITAMIN D, CHOLECALCIFEROL, PO Take by mouth daily.      [provider]  vitamin E 400 UNIT capsule Take 400 Units by mouth daily.      [provider]    Allergies    Patient has no known allergies.  Review of Systems   Review of Systems  Gastrointestinal:  Positive for abdominal pain, nausea and vomiting.   Genitourinary:  Positive for difficulty urinating and flank pain.  All other systems reviewed and are negative.  Physical Exam Updated Vital Signs BP 132/80   Pulse 73   Temp 97.9 F (36.6 C) (Oral)   Resp 17   Ht 5\' 6"  (1.676 m)   Wt 54.4 kg   SpO2 97%   BMI 19.37 kg/m   Physical Exam Vitals and nursing note reviewed.  Constitutional:      General: He is not in acute distress.    Appearance: Normal appearance.     Comments: nontoxic  HENT:     Head: Normocephalic and atraumatic.  Eyes:     Conjunctiva/sclera: Conjunctivae normal.     Pupils: Pupils are equal, round, and reactive to light.  Cardiovascular:     Rate and Rhythm: Normal rate and regular rhythm.     Pulses: Normal pulses.  Pulmonary:     Effort: Pulmonary effort is normal. No respiratory distress.     Breath sounds: Normal breath sounds. No wheezing.     Comments: Speaking in full sentences.  Clear lung sounds in all fields. Abdominal:     General: There is no distension.     Palpations: Abdomen is soft.     Tenderness: There is abdominal tenderness in the right lower quadrant. There is right CVA tenderness. There is no guarding or rebound. Negative signs include Murphy's sign and McBurney's sign.  Musculoskeletal:        General: Normal range of motion.     Cervical back: Normal range of motion and neck supple.  Skin:    General: Skin is warm and dry.     Capillary Refill: Capillary refill takes less than 2 seconds.  Neurological:     Mental Status: He is alert and oriented to person, place, and time.  Psychiatric:        Mood and Affect: Mood and affect normal.        Speech: Speech normal.        Behavior: Behavior normal.    ED Results / Procedures / Treatments   Labs (all labs ordered are listed, but only abnormal results are displayed) Labs Reviewed  CBC WITH DIFFERENTIAL/PLATELET - Abnormal; Notable for the following components:      Result Value   WBC 10.9 (*)    RBC 4.02 (*)    MCV  103.7 (*)    MCH 34.3 (*)    Neutro Abs 9.5 (*)    Lymphs Abs 0.4 (*)    All other components within normal limits  COMPREHENSIVE METABOLIC PANEL -  Abnormal; Notable for the following components:   Glucose, Bld 153 (*)    All other components within normal limits  URINALYSIS, ROUTINE W REFLEX MICROSCOPIC    EKG None  Radiology CT Renal Stone Study  Result Date: 10/04/2020 CLINICAL DATA:  Right flank pain. EXAM: CT ABDOMEN AND PELVIS WITHOUT CONTRAST TECHNIQUE: Multidetector CT imaging of the abdomen and pelvis was performed following the standard protocol without IV contrast. COMPARISON:  None. FINDINGS: Lower chest: No acute abnormality. Hepatobiliary: No focal liver abnormality is seen. No gallstones, gallbladder wall thickening, or biliary dilatation. Pancreas: Unremarkable. No pancreatic ductal dilatation or surrounding inflammatory changes. Spleen: Normal in size without focal abnormality. Adrenals/Urinary Tract: Adrenal glands appear normal. Mild right hydroureteronephrosis is noted secondary to 2 mm calculus at the right ureterovesical junction. Small nonobstructive left renal calculus is noted. Urinary bladder is otherwise unremarkable. Stomach/Bowel: Stomach appears normal. Stool is noted throughout the colon. There is no definite evidence of bowel obstruction. The appendix is not clearly visualized. Vascular/Lymphatic: Aortic atherosclerosis. No enlarged abdominal or pelvic lymph nodes. Reproductive: Prostate is unremarkable. Other: No abdominal wall hernia or abnormality. No abdominopelvic ascites. Musculoskeletal: No acute or significant osseous findings. IMPRESSION: Mild right hydroureteronephrosis is noted secondary to 2 mm calculus at the right ureterovesical junction. Small nonobstructive left renal calculus is noted. Aortic Atherosclerosis (ICD10-I70.0). Electronically Signed   By: Marijo Conception M.D.   On: 10/04/2020 10:39    Procedures Procedures   Medications Ordered in  ED Medications - No data to display  ED Course  I have reviewed the triage vital signs and the nursing notes.  Pertinent labs & imaging results that were available during my care of the patient were reviewed by me and considered in my medical decision making (see chart for details).    MDM Rules/Calculators/A&P                           Patient presenting for evaluation of flank pain and the urge to urinate without producing urine.  On exam, patient appears uncomfortable due to pain.  He has right CVA tenderness and tenderness to the right lower quadrant abdomen.  History of kidney stone, there is been many years.  Will obtain labs to ensure no kidney abnormality, CT to assess for kidney stone versus Pilo versus other obstructive cause, and urine to rule out infection.  Offered pain medication, patient declined.  Labs interpreted by me, overall reassuring.  Mild leukocytosis of 10, likely reactive.  Without fever, doubt infection.  UA negative for infection.  CT renal shows a 2 mm stone at the right UVJ.  Discussed findings with patient.  Offered Toradol and/or other pain control, patient declined.  Discussed that due to the size of the stone, patient should be able to pass this without needing interventions, and typical management is hydration and pain control.  Encourage follow-up with urology, resources given.  CT does not show any signs of bladder distention, discussed that patient's urge to urinate is likely radiating from his kidney stone, and will improve with a kidney stone passes.  At this time, patient appears safe for discharge.  Return precautions given.  Patient states he understands and agrees to plan.   Final Clinical Impression(s) / ED Diagnoses Final diagnoses:  Ureterolithiasis    Rx / DC Orders ED Discharge Orders          Ordered    ketorolac (TORADOL) 10 MG tablet  Every 8 hours  PRN        10/04/20 1228             Franchot Heidelberg, PA-C 10/04/20 1510     Daleen Bo, MD 10/04/20 1902

## 2020-10-04 NOTE — ED Notes (Signed)
Pt back from CT

## 2020-10-04 NOTE — ED Provider Notes (Signed)
  Face-to-face evaluation   History: He presents for evaluation of urinary urgency and dysuria tendon strain, for several days.  He has not had any known prior prostate problems.  He does not follow with a primary care doctor, and has never seen a urologist.  He denies fever, chills, shortness of breath, cough, weakness or dizziness.  Physical exam: Alert elderly male who is mildly anxious.  No dysarthria or aphasia.  No respiratory distress.  He ambulates normally.  Medical screening examination/treatment/procedure(s) were conducted as a shared visit with non-physician practitioner(s) and myself.  I personally evaluated the patient during the encounter    Daleen Bo, MD 10/04/20 1902

## 2023-02-16 DIAGNOSIS — H2513 Age-related nuclear cataract, bilateral: Secondary | ICD-10-CM | POA: Diagnosis not present

## 2023-02-16 DIAGNOSIS — H43811 Vitreous degeneration, right eye: Secondary | ICD-10-CM | POA: Diagnosis not present

## 2023-02-16 DIAGNOSIS — H524 Presbyopia: Secondary | ICD-10-CM | POA: Diagnosis not present

## 2023-08-17 IMAGING — CT CT RENAL STONE PROTOCOL
2 of 4 series · 16 of 46 positions shown, 18 images · non-contrast
Comparison: None.

CLINICAL DATA: Right flank pain.

EXAM:
CT ABDOMEN AND PELVIS WITHOUT CONTRAST
TECHNIQUE: Multidetector CT imaging of the abdomen and pelvis was performed
following the standard protocol without IV contrast.

[Series 2: axial st · axial · 0.68mm/px · z∈[-426,-56]mm · 13 of 86 slices shown, 15 images]
[im 6/86  soft-tissue]
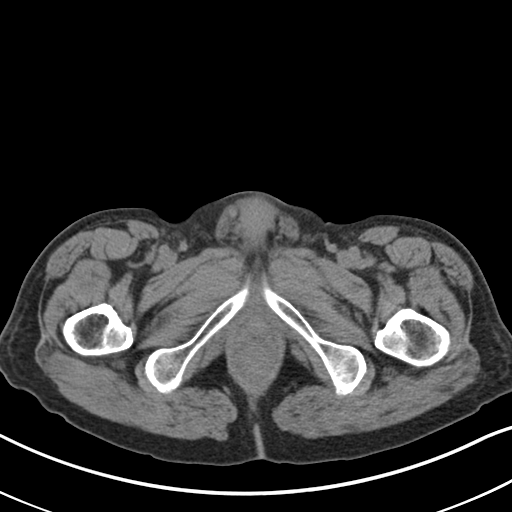
[im 6/86  bone]
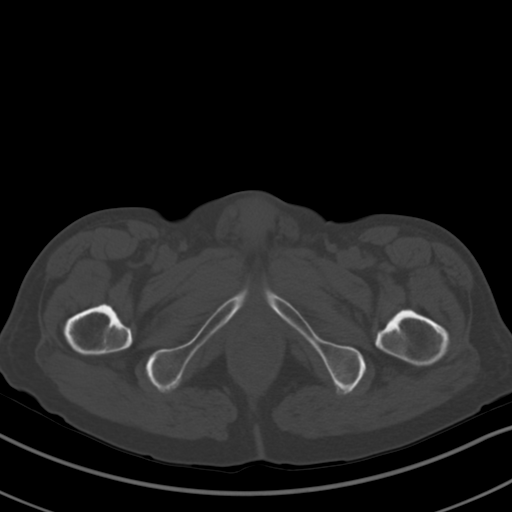
[im 11/86  soft-tissue]
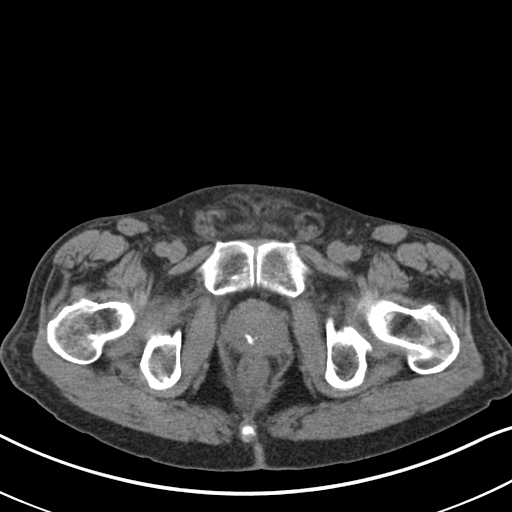
[im 16/86  soft-tissue]
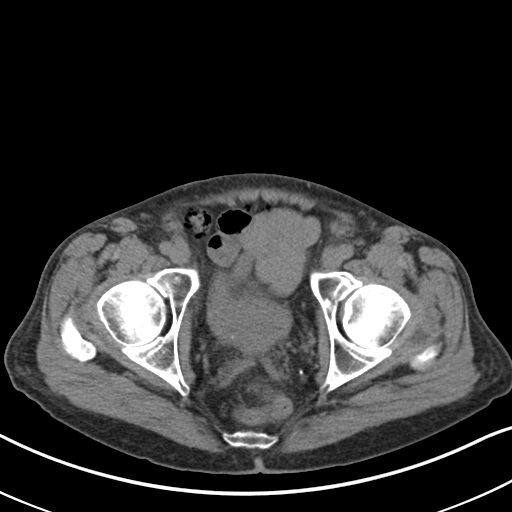
[im 27/86  soft-tissue]
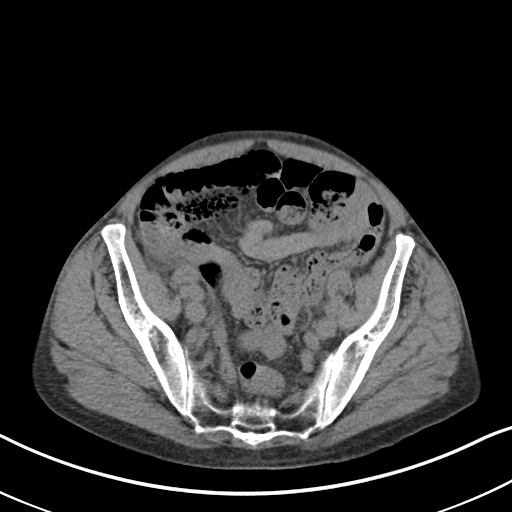
[im 32/86  soft-tissue]
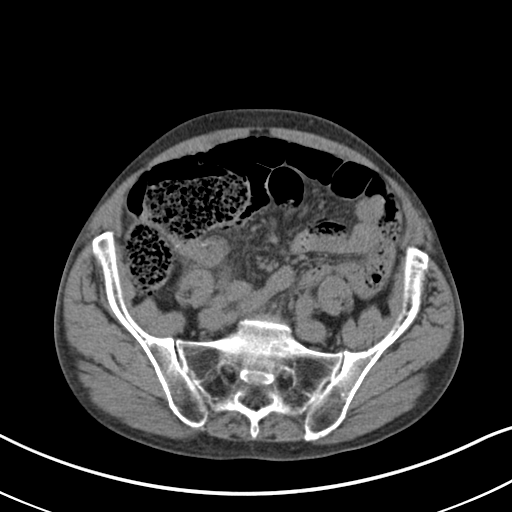
[im 38/86  soft-tissue]
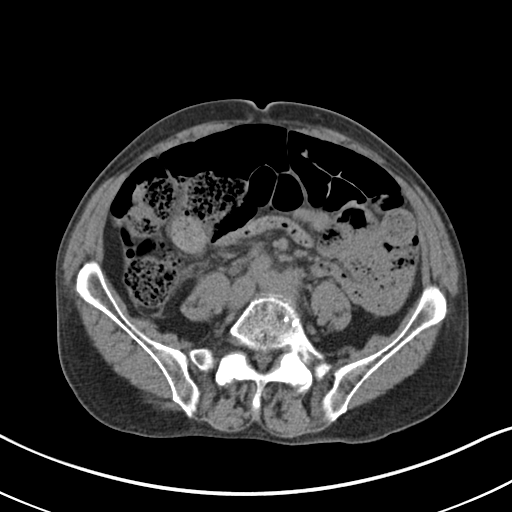
[im 43/86  soft-tissue]
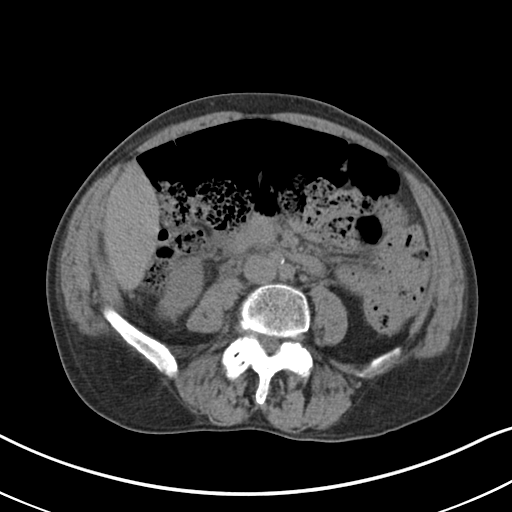
[im 48/86  soft-tissue]
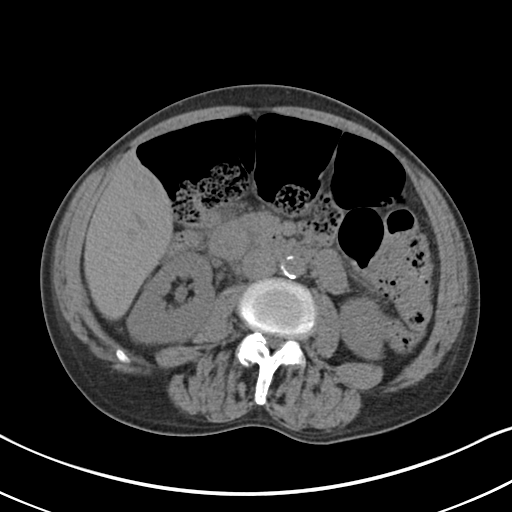
[im 54/86  soft-tissue]
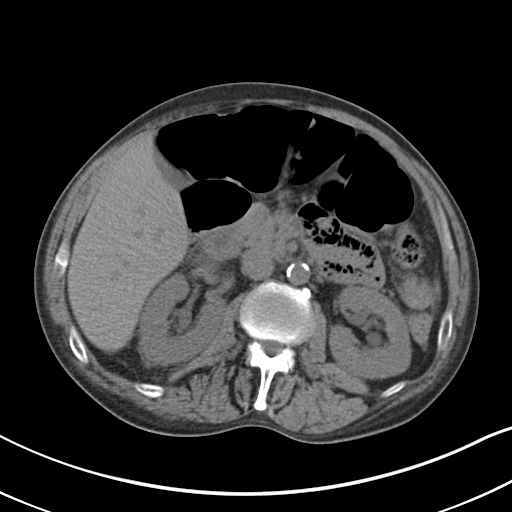
[im 54/86  bone]
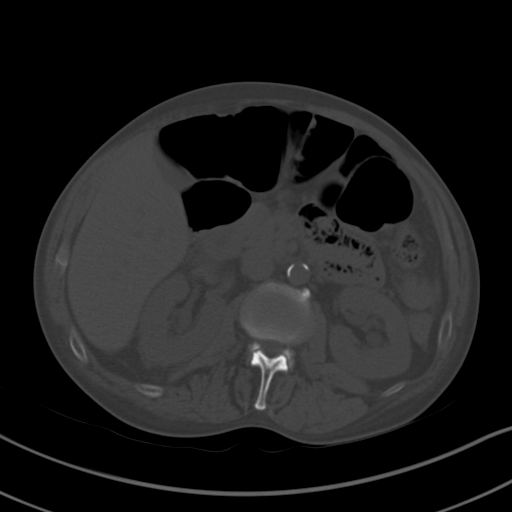
[im 59/86  soft-tissue]
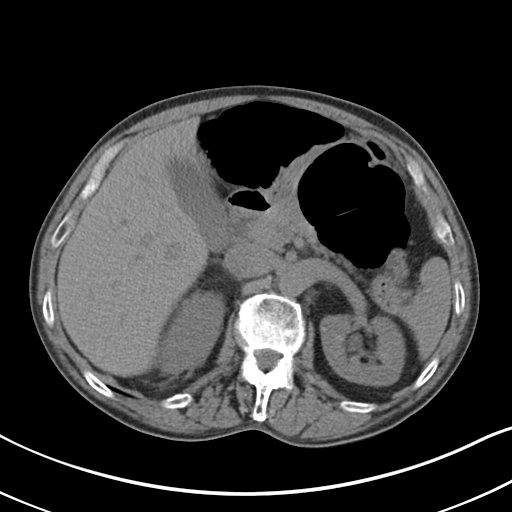
[im 70/86  soft-tissue]
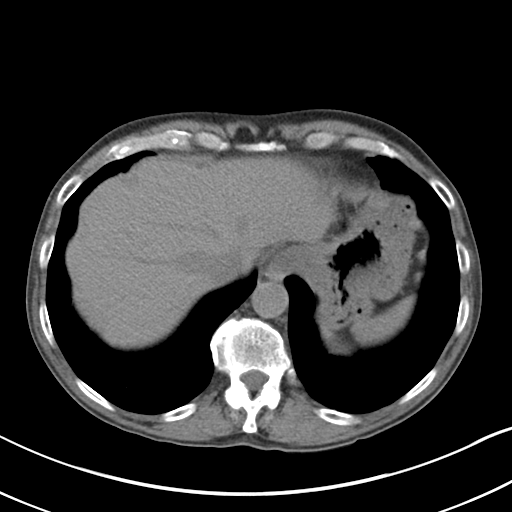
[im 75/86  soft-tissue]
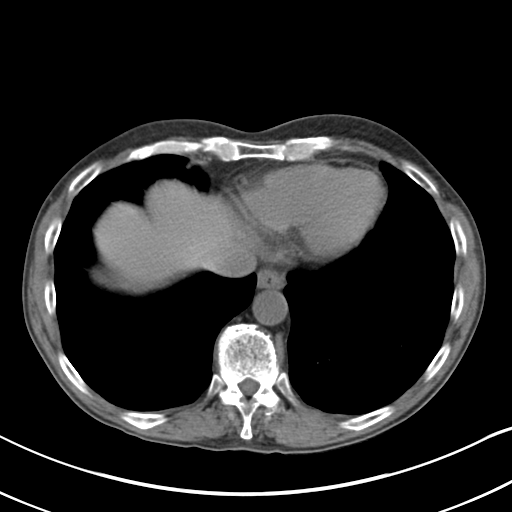
[im 80/86  soft-tissue]
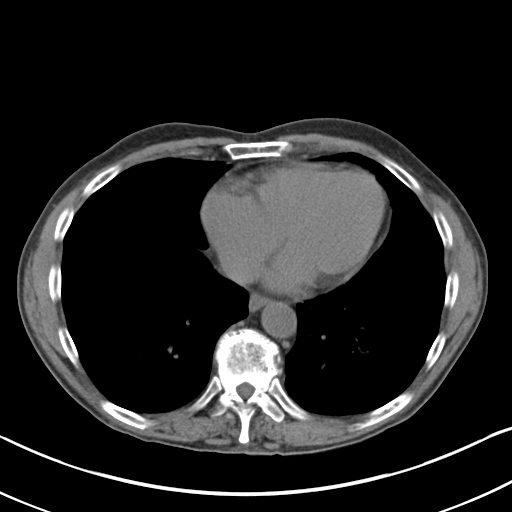

[Series 5: coronal · coronal · 0.68mm/px · 3 of 136 slices shown]
[im 46/136  soft-tissue]
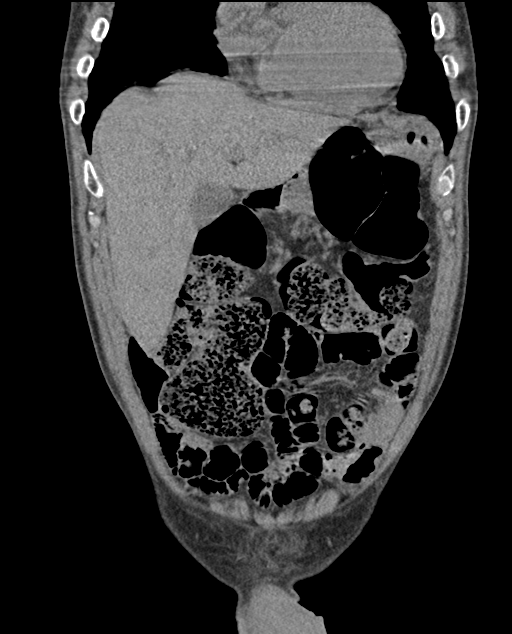
[im 61/136  soft-tissue]
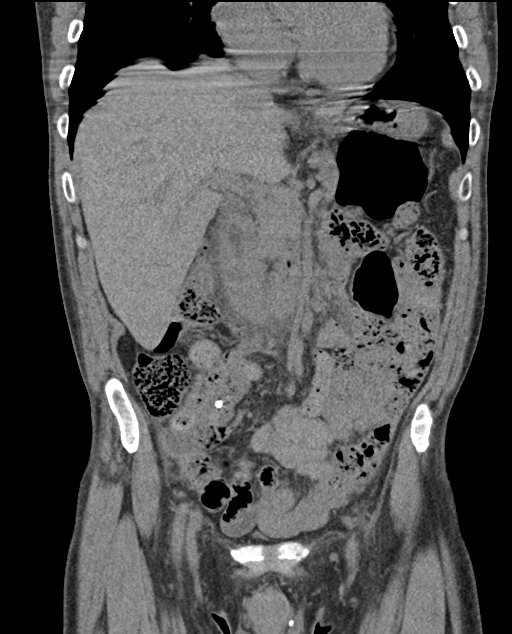
[im 76/136  soft-tissue]
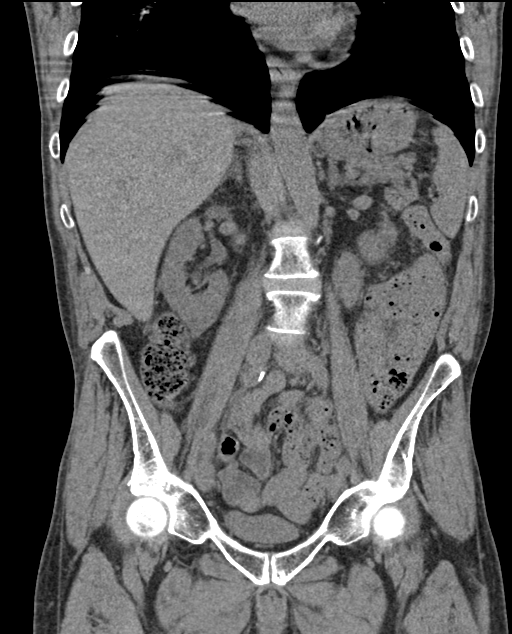

[16 of 46 positions shown; findings below may reference images not displayed]

FINDINGS: Lower chest: No acute abnormality.

Hepatobiliary: No focal liver abnormality is seen. No gallstones,
gallbladder wall thickening, or biliary dilatation.

Pancreas: Unremarkable. No pancreatic ductal dilatation or
surrounding inflammatory changes.

Spleen: Normal in size without focal abnormality.

Adrenals/Urinary Tract: Adrenal glands appear normal. Mild right
hydroureteronephrosis is noted secondary to 2 mm calculus at the
right ureterovesical junction. Small nonobstructive left renal
calculus is noted. Urinary bladder is otherwise unremarkable.

Stomach/Bowel: Stomach appears normal. Stool is noted throughout the
colon. There is no definite evidence of bowel obstruction. The
appendix is not clearly visualized.

Vascular/Lymphatic: Aortic atherosclerosis. No enlarged abdominal or
pelvic lymph nodes.

Reproductive: Prostate is unremarkable.

Other: No abdominal wall hernia or abnormality. No abdominopelvic
ascites.

Musculoskeletal: No acute or significant osseous findings.
IMPRESSION: Mild right hydroureteronephrosis is noted secondary to 2 mm calculus
at the right ureterovesical junction. Small nonobstructive left
renal calculus is noted.

Aortic Atherosclerosis ([id number]).
